# Patient Record
Sex: Female | Born: 1960 | Race: Black or African American | Hispanic: No | Marital: Single | Smoking: Never smoker
Health system: Southern US, Community
[De-identification: ages and names within clinical notes are randomized; demographics above are authoritative.]

## PROBLEM LIST (undated history)

## (undated) DIAGNOSIS — J45909 Unspecified asthma, uncomplicated: Secondary | ICD-10-CM

## (undated) DIAGNOSIS — Z973 Presence of spectacles and contact lenses: Secondary | ICD-10-CM

## (undated) DIAGNOSIS — T8859XA Other complications of anesthesia, initial encounter: Secondary | ICD-10-CM

## (undated) DIAGNOSIS — I7300 Raynaud's syndrome without gangrene: Secondary | ICD-10-CM

## (undated) DIAGNOSIS — T4145XA Adverse effect of unspecified anesthetic, initial encounter: Secondary | ICD-10-CM

## (undated) DIAGNOSIS — D649 Anemia, unspecified: Secondary | ICD-10-CM

## (undated) DIAGNOSIS — Z9289 Personal history of other medical treatment: Secondary | ICD-10-CM

## (undated) DIAGNOSIS — I341 Nonrheumatic mitral (valve) prolapse: Secondary | ICD-10-CM

## (undated) DIAGNOSIS — M5030 Other cervical disc degeneration, unspecified cervical region: Secondary | ICD-10-CM

## (undated) DIAGNOSIS — G250 Essential tremor: Secondary | ICD-10-CM

## (undated) DIAGNOSIS — R011 Cardiac murmur, unspecified: Secondary | ICD-10-CM

## (undated) DIAGNOSIS — K219 Gastro-esophageal reflux disease without esophagitis: Secondary | ICD-10-CM

## (undated) DIAGNOSIS — J302 Other seasonal allergic rhinitis: Secondary | ICD-10-CM

## (undated) DIAGNOSIS — K759 Inflammatory liver disease, unspecified: Secondary | ICD-10-CM

## (undated) DIAGNOSIS — M21961 Unspecified acquired deformity of right lower leg: Secondary | ICD-10-CM

## (undated) DIAGNOSIS — J449 Chronic obstructive pulmonary disease, unspecified: Secondary | ICD-10-CM

## (undated) DIAGNOSIS — Z8619 Personal history of other infectious and parasitic diseases: Secondary | ICD-10-CM

## (undated) DIAGNOSIS — G709 Myoneural disorder, unspecified: Secondary | ICD-10-CM

## (undated) DIAGNOSIS — R51 Headache: Secondary | ICD-10-CM

## (undated) DIAGNOSIS — Z972 Presence of dental prosthetic device (complete) (partial): Secondary | ICD-10-CM

## (undated) DIAGNOSIS — I10 Essential (primary) hypertension: Secondary | ICD-10-CM

## (undated) DIAGNOSIS — F419 Anxiety disorder, unspecified: Secondary | ICD-10-CM

## (undated) DIAGNOSIS — M1990 Unspecified osteoarthritis, unspecified site: Secondary | ICD-10-CM

## (undated) DIAGNOSIS — R29898 Other symptoms and signs involving the musculoskeletal system: Secondary | ICD-10-CM

## (undated) DIAGNOSIS — J189 Pneumonia, unspecified organism: Secondary | ICD-10-CM

## (undated) HISTORY — PX: EXPLORATORY LAPAROTOMY: SUR591

## (undated) HISTORY — PX: CORRECTION HAMMER TOE: SUR317

## (undated) HISTORY — PX: TUBAL LIGATION: SHX77

## (undated) HISTORY — PX: LUMBAR DISC SURGERY: SHX700

## (undated) HISTORY — PX: COMBINED HYSTEROSCOPY DIAGNOSTIC / D&C: SUR297

## (undated) HISTORY — PX: BACK SURGERY: SHX140

## (undated) HISTORY — DX: Chronic obstructive pulmonary disease, unspecified: J44.9

## (undated) HISTORY — DX: Unspecified asthma, uncomplicated: J45.909

## (undated) HISTORY — PX: COLONOSCOPY: SHX174

---

## 1984-10-25 HISTORY — PX: FEMUR FRACTURE SURGERY: SHX633

## 1984-10-25 HISTORY — PX: EXPLORATORY LAPAROTOMY: SUR591

## 1992-10-25 HISTORY — PX: ANTERIOR AND POSTERIOR VAGINAL REPAIR: SUR5

## 1999-02-20 ENCOUNTER — Emergency Department (HOSPITAL_COMMUNITY): Admission: EM | Admit: 1999-02-20 | Discharge: 1999-02-20 | Payer: Self-pay | Admitting: Emergency Medicine

## 1999-05-04 ENCOUNTER — Other Ambulatory Visit: Admission: RE | Admit: 1999-05-04 | Discharge: 1999-05-04 | Payer: Self-pay | Admitting: Internal Medicine

## 1999-07-31 ENCOUNTER — Encounter (INDEPENDENT_AMBULATORY_CARE_PROVIDER_SITE_OTHER): Payer: Self-pay | Admitting: Specialist

## 1999-07-31 ENCOUNTER — Other Ambulatory Visit: Admission: RE | Admit: 1999-07-31 | Discharge: 1999-07-31 | Payer: Self-pay | Admitting: Obstetrics and Gynecology

## 1999-11-09 ENCOUNTER — Encounter: Payer: Self-pay | Admitting: Obstetrics and Gynecology

## 1999-11-09 ENCOUNTER — Ambulatory Visit (HOSPITAL_COMMUNITY): Admission: RE | Admit: 1999-11-09 | Discharge: 1999-11-09 | Payer: Self-pay | Admitting: Obstetrics and Gynecology

## 1999-11-30 ENCOUNTER — Ambulatory Visit (HOSPITAL_COMMUNITY): Admission: RE | Admit: 1999-11-30 | Discharge: 1999-11-30 | Payer: Self-pay | Admitting: Obstetrics and Gynecology

## 1999-11-30 ENCOUNTER — Encounter: Payer: Self-pay | Admitting: Obstetrics and Gynecology

## 1999-12-31 ENCOUNTER — Inpatient Hospital Stay (HOSPITAL_COMMUNITY): Admission: AD | Admit: 1999-12-31 | Discharge: 2000-01-02 | Payer: Self-pay | Admitting: Obstetrics and Gynecology

## 2000-01-02 ENCOUNTER — Encounter: Payer: Self-pay | Admitting: Obstetrics and Gynecology

## 2000-01-27 ENCOUNTER — Inpatient Hospital Stay (HOSPITAL_COMMUNITY): Admission: AD | Admit: 2000-01-27 | Discharge: 2000-01-27 | Payer: Self-pay | Admitting: Obstetrics and Gynecology

## 2000-03-16 ENCOUNTER — Encounter (INDEPENDENT_AMBULATORY_CARE_PROVIDER_SITE_OTHER): Payer: Self-pay | Admitting: Specialist

## 2000-03-16 ENCOUNTER — Inpatient Hospital Stay (HOSPITAL_COMMUNITY): Admission: AD | Admit: 2000-03-16 | Discharge: 2000-03-18 | Payer: Self-pay | Admitting: Obstetrics and Gynecology

## 2000-03-24 ENCOUNTER — Inpatient Hospital Stay (HOSPITAL_COMMUNITY): Admission: AD | Admit: 2000-03-24 | Discharge: 2000-03-24 | Payer: Self-pay | Admitting: *Deleted

## 2000-05-05 ENCOUNTER — Other Ambulatory Visit: Admission: RE | Admit: 2000-05-05 | Discharge: 2000-05-05 | Payer: Self-pay | Admitting: Obstetrics and Gynecology

## 2000-12-02 ENCOUNTER — Ambulatory Visit (HOSPITAL_COMMUNITY)
Admission: RE | Admit: 2000-12-02 | Discharge: 2000-12-02 | Payer: Self-pay | Admitting: Physical Medicine and Rehabilitation

## 2001-09-20 ENCOUNTER — Ambulatory Visit (HOSPITAL_COMMUNITY): Admission: RE | Admit: 2001-09-20 | Discharge: 2001-09-20 | Payer: Self-pay | Admitting: Gastroenterology

## 2001-09-20 ENCOUNTER — Encounter: Payer: Self-pay | Admitting: Gastroenterology

## 2001-10-31 ENCOUNTER — Ambulatory Visit (HOSPITAL_COMMUNITY): Admission: RE | Admit: 2001-10-31 | Discharge: 2001-10-31 | Payer: Self-pay | Admitting: Gastroenterology

## 2001-10-31 ENCOUNTER — Encounter (INDEPENDENT_AMBULATORY_CARE_PROVIDER_SITE_OTHER): Payer: Self-pay | Admitting: Specialist

## 2002-05-30 ENCOUNTER — Other Ambulatory Visit: Admission: RE | Admit: 2002-05-30 | Discharge: 2002-05-30 | Payer: Self-pay | Admitting: Obstetrics and Gynecology

## 2002-06-06 ENCOUNTER — Encounter: Admission: RE | Admit: 2002-06-06 | Discharge: 2002-06-06 | Payer: Self-pay | Admitting: Obstetrics and Gynecology

## 2002-06-06 ENCOUNTER — Encounter: Payer: Self-pay | Admitting: Obstetrics and Gynecology

## 2002-10-25 HISTORY — PX: LUMBAR LAMINECTOMY: SHX95

## 2003-02-02 ENCOUNTER — Emergency Department (HOSPITAL_COMMUNITY): Admission: EM | Admit: 2003-02-02 | Discharge: 2003-02-02 | Payer: Self-pay | Admitting: Emergency Medicine

## 2003-02-12 ENCOUNTER — Ambulatory Visit (HOSPITAL_COMMUNITY): Admission: RE | Admit: 2003-02-12 | Discharge: 2003-02-12 | Payer: Self-pay | Admitting: Family Medicine

## 2003-02-20 ENCOUNTER — Ambulatory Visit (HOSPITAL_COMMUNITY): Admission: RE | Admit: 2003-02-20 | Discharge: 2003-02-22 | Payer: Self-pay | Admitting: Neurosurgery

## 2003-06-03 ENCOUNTER — Ambulatory Visit (HOSPITAL_COMMUNITY): Admission: RE | Admit: 2003-06-03 | Discharge: 2003-06-03 | Payer: Self-pay | Admitting: Neurosurgery

## 2003-06-24 ENCOUNTER — Encounter: Admission: RE | Admit: 2003-06-24 | Discharge: 2003-08-23 | Payer: Self-pay | Admitting: Neurosurgery

## 2003-07-26 ENCOUNTER — Other Ambulatory Visit: Admission: RE | Admit: 2003-07-26 | Discharge: 2003-07-26 | Payer: Self-pay | Admitting: Obstetrics and Gynecology

## 2003-08-26 ENCOUNTER — Ambulatory Visit (HOSPITAL_COMMUNITY): Admission: RE | Admit: 2003-08-26 | Discharge: 2003-08-26 | Payer: Self-pay | Admitting: Pediatrics

## 2003-08-26 ENCOUNTER — Encounter (INDEPENDENT_AMBULATORY_CARE_PROVIDER_SITE_OTHER): Payer: Self-pay | Admitting: Specialist

## 2003-08-30 ENCOUNTER — Ambulatory Visit (HOSPITAL_COMMUNITY): Admission: RE | Admit: 2003-08-30 | Discharge: 2003-08-30 | Payer: Self-pay | Admitting: Neurosurgery

## 2003-12-20 ENCOUNTER — Emergency Department (HOSPITAL_COMMUNITY): Admission: EM | Admit: 2003-12-20 | Discharge: 2003-12-20 | Payer: Self-pay | Admitting: Emergency Medicine

## 2004-01-12 ENCOUNTER — Inpatient Hospital Stay (HOSPITAL_COMMUNITY): Admission: EM | Admit: 2004-01-12 | Discharge: 2004-01-14 | Payer: Self-pay | Admitting: Emergency Medicine

## 2004-08-25 ENCOUNTER — Other Ambulatory Visit: Admission: RE | Admit: 2004-08-25 | Discharge: 2004-08-25 | Payer: Self-pay | Admitting: Obstetrics and Gynecology

## 2004-08-31 ENCOUNTER — Ambulatory Visit: Payer: Self-pay | Admitting: Gastroenterology

## 2004-11-09 ENCOUNTER — Ambulatory Visit: Payer: Self-pay | Admitting: Gastroenterology

## 2005-03-18 ENCOUNTER — Ambulatory Visit: Payer: Self-pay | Admitting: Gastroenterology

## 2005-07-07 ENCOUNTER — Emergency Department (HOSPITAL_COMMUNITY): Admission: EM | Admit: 2005-07-07 | Discharge: 2005-07-07 | Payer: Self-pay | Admitting: Emergency Medicine

## 2005-09-03 ENCOUNTER — Other Ambulatory Visit: Admission: RE | Admit: 2005-09-03 | Discharge: 2005-09-03 | Payer: Self-pay | Admitting: Obstetrics and Gynecology

## 2006-01-19 ENCOUNTER — Emergency Department (HOSPITAL_COMMUNITY): Admission: EM | Admit: 2006-01-19 | Discharge: 2006-01-19 | Payer: Self-pay | Admitting: Emergency Medicine

## 2006-10-12 ENCOUNTER — Encounter: Admission: RE | Admit: 2006-10-12 | Discharge: 2006-10-12 | Payer: Self-pay | Admitting: Emergency Medicine

## 2006-12-09 ENCOUNTER — Ambulatory Visit: Payer: Self-pay | Admitting: Gastroenterology

## 2008-02-27 ENCOUNTER — Ambulatory Visit: Payer: Self-pay | Admitting: Gastroenterology

## 2008-08-09 ENCOUNTER — Ambulatory Visit: Payer: Self-pay | Admitting: Internal Medicine

## 2008-08-14 ENCOUNTER — Ambulatory Visit: Payer: Self-pay | Admitting: *Deleted

## 2008-10-23 ENCOUNTER — Ambulatory Visit: Payer: Self-pay | Admitting: Family Medicine

## 2008-10-23 LAB — CONVERTED CEMR LAB
AST: 75 units/L — ABNORMAL HIGH (ref 0–37)
Albumin: 4.1 g/dL (ref 3.5–5.2)
Amphetamine Screen, Ur: NEGATIVE
Basophils Absolute: 0 10*3/uL (ref 0.0–0.1)
Basophils Relative: 1 % (ref 0–1)
Benzodiazepines.: NEGATIVE
Cocaine Metabolites: NEGATIVE
Creatinine, Ser: 0.74 mg/dL (ref 0.40–1.20)
Eosinophils Absolute: 0.2 10*3/uL (ref 0.0–0.7)
Eosinophils Relative: 2 % (ref 0–5)
HDL: 66 mg/dL (ref >39)
LDL Cholesterol: 76 mg/dL (ref 0–99)
Monocytes Absolute: 0.6 10*3/uL (ref 0.1–1.0)
Monocytes Relative: 8 % (ref 3–12)
Neutro Abs: 4.6 10*3/uL (ref 1.7–7.7)
Neutrophils Relative %: 60 % (ref 43–77)
Potassium: 3.6 meq/L (ref 3.5–5.3)
RBC: 4.12 M/uL (ref 3.87–5.11)
Sodium: 140 meq/L (ref 135–145)
TSH: 2.2 microintl units/mL (ref 0.350–4.50)
Total CHOL/HDL Ratio: 2.5
Total Protein: 7.6 g/dL (ref 6.0–8.3)
VLDL: 23 mg/dL (ref 0–40)
WBC: 7.6 10*3/uL (ref 4.0–10.5)

## 2008-11-08 ENCOUNTER — Ambulatory Visit: Payer: Self-pay | Admitting: Internal Medicine

## 2009-01-03 ENCOUNTER — Ambulatory Visit: Payer: Self-pay | Admitting: Internal Medicine

## 2009-01-15 ENCOUNTER — Ambulatory Visit: Payer: Self-pay | Admitting: Family Medicine

## 2009-02-10 ENCOUNTER — Ambulatory Visit: Payer: Self-pay | Admitting: Internal Medicine

## 2009-02-17 ENCOUNTER — Ambulatory Visit: Payer: Self-pay | Admitting: Internal Medicine

## 2009-09-01 ENCOUNTER — Ambulatory Visit: Payer: Self-pay | Admitting: Internal Medicine

## 2009-09-29 ENCOUNTER — Encounter: Payer: Self-pay | Admitting: Family Medicine

## 2009-09-29 ENCOUNTER — Ambulatory Visit: Payer: Self-pay | Admitting: Family Medicine

## 2009-09-29 LAB — CONVERTED CEMR LAB
ALT: 41 units/L — ABNORMAL HIGH (ref 0–35)
AST: 33 units/L (ref 0–37)
Alkaline Phosphatase: 63 units/L (ref 39–117)
BUN: 11 mg/dL (ref 6–23)
CO2: 26 meq/L (ref 19–32)
Calcium: 8.9 mg/dL (ref 8.4–10.5)
Chloride: 107 meq/L (ref 96–112)
Sodium: 141 meq/L (ref 135–145)
Total Bilirubin: 0.5 mg/dL (ref 0.3–1.2)
Total Protein: 7.2 g/dL (ref 6.0–8.3)

## 2009-10-21 ENCOUNTER — Encounter (INDEPENDENT_AMBULATORY_CARE_PROVIDER_SITE_OTHER): Payer: Self-pay | Admitting: Internal Medicine

## 2009-10-21 ENCOUNTER — Ambulatory Visit: Payer: Self-pay | Admitting: Family Medicine

## 2009-10-21 LAB — CONVERTED CEMR LAB
ALT: 44 units/L — ABNORMAL HIGH (ref 0–35)
AST: 48 units/L — ABNORMAL HIGH (ref 0–37)
Anti Nuclear Antibody(ANA): NEGATIVE
Basophils Absolute: 0 10*3/uL (ref 0.0–0.1)
Basophils Relative: 0 % (ref 0–1)
CO2: 26 meq/L (ref 19–32)
Calcium: 8.8 mg/dL (ref 8.4–10.5)
Creatinine, Ser: 0.78 mg/dL (ref 0.40–1.20)
Eosinophils Absolute: 0.1 10*3/uL (ref 0.0–0.7)
Eosinophils Relative: 2 % (ref 0–5)
Hemoglobin: 12.5 g/dL (ref 12.0–15.0)
Lymphocytes Relative: 23 % (ref 12–46)
Monocytes Absolute: 0.3 10*3/uL (ref 0.1–1.0)
Neutrophils Relative %: 72 % (ref 43–77)
Sodium: 140 meq/L (ref 135–145)
Total Protein: 7.3 g/dL (ref 6.0–8.3)

## 2009-10-23 ENCOUNTER — Ambulatory Visit: Payer: Self-pay | Admitting: Internal Medicine

## 2010-04-07 ENCOUNTER — Ambulatory Visit: Payer: Self-pay | Admitting: Gastroenterology

## 2010-05-01 ENCOUNTER — Ambulatory Visit: Payer: Self-pay | Admitting: Gastroenterology

## 2010-05-04 LAB — PATHOLOGY REPORT

## 2010-08-10 ENCOUNTER — Ambulatory Visit: Payer: Self-pay | Admitting: Gastroenterology
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
## 2011-03-12 NOTE — Op Note (Signed)
[facility]  Patient:    Patricia Simpson, Patricia Simpson                      MRN: [id number] Proc. Date: 03/16/00 Adm. Date:  [id number] Disc. Date: [id number] Attending:  Trevor Iha                           Operative Report  PREOPERATIVE DIAGNOSIS:       Multiparity, desires sterility.  POSTOPERATIVE DIAGNOSIS:      Multiparity, desires sterility.  OPERATION:                    Modified Pomeroy bilateral tubal ligation.  SURGEON:                      Trevor Iha, M.D.  ASSISTANT:  ANESTHESIA:                   Epidural.  ESTIMATED BLOOD LOSS:  INDICATIONS:                  [name] is a 50 year old, gravida 3, para 3,  status post vaginal delivery.  She adamantly desires sterilization.  Risks and benefits were discussed at length including failure rate of 5 out of 1000. Informed consent was obtained.  DESCRIPTION OF PROCEDURE:     After adequate analgesia, the patient was placed n the supine position.  She was sterilely prepped and draped.  A 2 cm infraumbilical skin incision was made, taken down sharply to the fascia which was incised transversely.  The peritoneum was entered sharply.  Army-Navy retractors were placed.  The right fallopian tube was identified by the fimbriated end, the midportion of the tube was grasped with Babcock clamp.  Two sutures of 0 plain ere ligated around 2 cm knuckle of fallopian tube.  The central portion was excised. The tubal ostia were noted to be hemostatic.  A suture of 2-0 silk was used to ligate the proximal section of the tube.  The tube was released back into the peritoneal cavity.  The left fallopian tube was identified in a similar fashion by the fimbriated end.  The midportion of the tube was grasped with a Babcock clamp. A knuckle of tube was doubly ligated with 0 plain suture.  Approximately 2 cm section was excised.  The tubal ostia was noted to be hemostatic.  2-0 silk was  used to  ligate the proximal portion.  The tube was released back into the peritoneal cavity.  The fascia was closed with a 0 Vicryl running suture.  The kin was then closed with a 3-0 Rapide subcuticular suture.  It was injected with 0.25% Marcaine approximately 7 cc.  The patient tolerated the procedure well and was stable on transfer to the recovery room.  Sponge, needle, and instrument counts  were correct x 3.  Estimated blood loss was less than 10 cc. DD:  03/16/00 TD:  03/19/00 Job: [id number]

## 2011-03-12 NOTE — Op Note (Signed)
NAME:  Patricia Simpson, Patricia Simpson                         ACCOUNT NO.:  [id number]   MEDICAL RECORD NO.:  [id number]                   PATIENT TYPE:  AMB   LOCATION:  SDC                                  [facility]ACILITY[facility]   PHYSICIAN:  Dineen Kid. Rana Snare, M.D.                 DATE OF BIRTH:  1961/08/07   DATE OF PROCEDURE:  08/26/2003  DATE OF DISCHARGE:                                 OPERATIVE REPORT   PREOPERATIVE DIAGNOSES:  1. Abnormal uterine bleeding.  2. Endometrial mass.  3. Fibroids.   POSTOPERATIVE DIAGNOSES:  1. Abnormal uterine bleeding.  2. Endometrial mass.  3. Fibroids.  4. Endometrial polyps.   PROCEDURE:  Hysteroscopy D&C.   SURGEON:  Dineen Kid. Rana Snare, M.D.   ANESTHESIA:  Monitored anesthetic care and paracervical block.   INDICATIONS FOR PROCEDURE:  Ms. Veith is a 50 year old with abnormal  uterine bleeding.  She is status post tubal ligation.  She underwent a  sonohistogram which shows several endometrial masses consistent with polyps  and also small fibroid.  She presents for hysteroscopy D&C for evaluation  and removal of these polyps.  Risks and benefits were discussed at length  and full consent was obtained.   FINDINGS:  Multiple polyps, approximately 3, noticed along the anterior  surface, otherwise normal-appearing ostia, cervix, normal-appearing  endometrial lining after the D&C.   DESCRIPTION OF PROCEDURE:  After adequate analgesia, the patient was placed  in the dorsal lithotomy position.  She was sterilely prepped and draped.  The bladder was sterilely drained.  Graves speculum was placed.  Tenaculum  was placed in the anterior lip of the cervix and paracervical block was  placed with 1% Xylocaine with 1:100,000 epinephrine, 20 mL used.  Uterus  sounded to 8 cm, easily dilated to a #27 Pratt dilator.  Hysteroscope was  inserted and the above findings were noted.  Curettage was performed,  retrieving medium sized polyps.  This was performed until a gritty  surface  was felt throughout the entire endometrial cavity.  After reexamination with  the hysteroscope revealed normal-appearing endometrial cavity, at this point  no residual polyps or underlying masses.  The tenaculum was removed from the  anterior lip of the cervix.  Speculum was removed.  The patient was  transferred to the recovery room in stable condition.  Sorbitol deficit was  30 mL.   ESTIMATED BLOOD LOSS:  10 mL.   The patient will be discharged home and will follow up in the office in two  to three weeks.  Sent home with a routine instruction sheet for D&C.  She  was given 30 mg of Toradol IV in the OR and Cipro 400 mg IV.  Dineen Kid Rana Snare, M.D.    DCL/MEDQ  D:  08/26/2003  T:  08/26/2003  Job:  [id number]
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
## 2011-03-12 NOTE — Op Note (Signed)
NAME:  Patricia Simpson, Patricia Simpson                         ACCOUNT NO.:  [id number]   MEDICAL RECORD NO.:  [id number]                   PATIENT TYPE:  OIB   LOCATION:  3005                                 FACILITY:  [facility]   PHYSICIAN:  Cristi Loron, M.D.            DATE OF BIRTH:  05-02-1961   DATE OF PROCEDURE:  02/20/2003  DATE OF DISCHARGE:                                 OPERATIVE REPORT   PREOPERATIVE DIAGNOSES:  Left L4-5 herniated nucleus pulposus, spinal  stenosis, lumbar radiculopathy, and lumbago.   POSTOPERATIVE DIAGNOSES:  Left L4-5 herniated nucleus pulposus, spinal  stenosis, lumbar radiculopathy, and lumbago.   PROCEDURE:  Left L4-5 microdiskectomy using microdissection.   SURGEON:  Cristi Loron, M.D.   ASSISTANT:  Clydene Fake, M.D.   ANESTHESIA:  General endotracheal.   ESTIMATED BLOOD LOSS:  Minimal.   SPECIMENS:  None.   DRAINS:  None.   COMPLICATIONS:  None.   BRIEF HISTORY:  The patient is a 50 year old black female who has suffered  from back and left leg pain.  She failed medical management and was worked  up with a lumbar MRI, which demonstrated a herniated disk at L4-5 on the  left compressing the left L4 nerve root.  The patient's signs, symptoms, and  physical exam were consistent with a left L4 radiculopathy.  I discussed the  various treatment options with her, including doing nothing, continued  medical management, steroid injections, and surgery, etc.  The patient  weighed the risks, benefits, and alternatives of surgery and decided to  proceed with a microdiskectomy.   DESCRIPTION OF PROCEDURE:  The patient was brought to the operating room by  the anesthesia team.  General endotracheal anesthesia was induced.  The  patient was then turned to the prone position on the Wilson frame.  The  lumbosacral region was then prepared with Betadine scrub and Betadine  solution and sterile drapes were applied.  I then injected the area to be  incised with Marcaine with epinephrine solution and used the scalpel to make  a linear midline incision over the L4-5 interspace.  I used electrocautery  to dissect down to the thoracolumbar fascia and divided the fascia just to  the left of midline, performing a left-sided subperiosteal dissection,  stripping the paraspinous musculature from the left spinous process and  lamina of L4 and L5.  I inserted the McCullough retractor for exposure and  then obtained the intraoperative radiograph to confirm our location.   We then brought the operating microscope into the field and under its  magnification and illumination, we completed the  microdissection/decompression.  We used the high-speed drill to perform a  left L4 laminotomy.  We completed the left L4 hemilaminectomy with the  Kerrison punch and removed the ligamentum flavum at L3-4 and L4-5.  We then  performed a foraminotomy about the left L5 and L4 nerve root.  We then used  microdissection to free up the thecal sac and the L4 and L5 nerve root from  the epidural tissue.  We encountered a large free-fragment herniated disk  which appeared to have migrated caudally from the L4-5 disk and was directly  ventral to the L4 nerve root as it exited the neural foramen.  We freed it  up with the microdissectors and then removed it in multiple fragments using  a pituitary forceps.  We then palpated along the ventral surface of the  thecal sac and along the exit route of the L4 nerve root and L5 nerve root  and noted that the neural structures were well-decompressed. We inspected  the L4-5 intervertebral disk.  We did not see any large holes in the annulus  or any impending herniations.  We therefore did not enter into the  intervertebral disk space.  We achieved stringent hemostasis using bipolar  electrocautery and then copiously irrigated the wound with bacitracin  solution and removed the solution.   I should mention that during the  diskectomy/removal of the free fragment Dr.  Phoebe Perch held the thecal sac and the L4 nerve root medially with the D'Errico  retractor.   We then removed the McCullough retractor and then reapproximated the  patient's thoracolumbar fascia with interrupted #1 Vicryl suture, the  subcutaneous tissue with interrupted 2-0 Vicryl, and the skin with Steri-  Strips and Benzoin.  The wound was then coated with bacitracin ointment and  a sterile dressing was applied, the drapes were removed, and the patient was  subsequently returned to the supine position, where she was extubated by the  anesthesia team and transported to the postanesthesia care unit in stable  condition.  All sponge, instrument, and needle counts were correct at the  end of this case.                                               Cristi Loron, M.D.    JDJ/MEDQ  D:  02/20/2003  T:  02/21/2003  Job:  [id number]
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
## 2012-06-12 ENCOUNTER — Other Ambulatory Visit: Payer: Self-pay | Admitting: Nurse Practitioner

## 2012-06-12 DIAGNOSIS — R42 Dizziness and giddiness: Secondary | ICD-10-CM

## 2012-06-12 DIAGNOSIS — M542 Cervicalgia: Secondary | ICD-10-CM

## 2012-06-13 ENCOUNTER — Ambulatory Visit
Admission: RE | Admit: 2012-06-13 | Discharge: 2012-06-13 | Disposition: A | Discharge status: Home / Self Care | Payer: BC Managed Care – PPO | Source: Ambulatory Visit | Attending: Nurse Practitioner | Admitting: Nurse Practitioner

## 2012-06-13 DIAGNOSIS — M542 Cervicalgia: Secondary | ICD-10-CM

## 2012-06-13 DIAGNOSIS — R42 Dizziness and giddiness: Secondary | ICD-10-CM

## 2012-06-13 MED ORDER — GADOBENATE DIMEGLUMINE 529 MG/ML IV SOLN
11.0000 mL | Freq: Once | INTRAVENOUS | Status: AC | PRN
  Administered 2012-06-13: 11 mL via INTRAVENOUS

## 2012-06-16 ENCOUNTER — Other Ambulatory Visit: Payer: Self-pay

## 2012-07-14 ENCOUNTER — Other Ambulatory Visit: Payer: Self-pay | Admitting: Neurology

## 2012-07-14 DIAGNOSIS — R519 Headache, unspecified: Secondary | ICD-10-CM

## 2012-07-14 DIAGNOSIS — R2681 Unsteadiness on feet: Secondary | ICD-10-CM

## 2012-07-14 DIAGNOSIS — R200 Anesthesia of skin: Secondary | ICD-10-CM

## 2012-07-14 DIAGNOSIS — R202 Paresthesia of skin: Secondary | ICD-10-CM

## 2012-07-31 ENCOUNTER — Other Ambulatory Visit: Payer: BC Managed Care – PPO

## 2012-09-15 ENCOUNTER — Other Ambulatory Visit: Payer: Self-pay | Admitting: Neurosurgery

## 2012-09-15 DIAGNOSIS — IMO0002 Reserved for concepts with insufficient information to code with codable children: Secondary | ICD-10-CM

## 2012-09-20 ENCOUNTER — Ambulatory Visit
Admission: RE | Admit: 2012-09-20 | Discharge: 2012-09-20 | Disposition: A | Discharge status: Home / Self Care | Payer: BC Managed Care – PPO | Source: Ambulatory Visit | Attending: Neurosurgery | Admitting: Neurosurgery

## 2012-09-20 DIAGNOSIS — IMO0002 Reserved for concepts with insufficient information to code with codable children: Secondary | ICD-10-CM

## 2012-09-20 MED ORDER — GADOBENATE DIMEGLUMINE 529 MG/ML IV SOLN
11.0000 mL | Freq: Once | INTRAVENOUS | Status: AC | PRN
  Administered 2012-09-20: 11 mL via INTRAVENOUS

## 2012-10-13 ENCOUNTER — Other Ambulatory Visit: Payer: BC Managed Care – PPO

## 2012-10-16 ENCOUNTER — Other Ambulatory Visit: Payer: BC Managed Care – PPO

## 2013-03-26 ENCOUNTER — Other Ambulatory Visit: Payer: Self-pay | Admitting: Neurosurgery

## 2013-04-12 NOTE — Pre-Procedure Instructions (Addendum)
Patricia Simpson  04/12/2013   Your procedure is scheduled on: Tuesday, July 1st.    Report to [facility] at 12:00 Noon.             (You will come through Entrance "A"..follow signs to [facility],  take those to 3rd floor)                Call this number if you have problems the morning of surgery: [phone]   Remember:   Do not eat food or drink liquids after midnight Monday.   Take these medicines the morning of surgery with A SIP OF WATER:    Do not wear jewelry, make-up or nail polish.  Do not wear lotions, powders, or perfumes. You may NOT wear deodorant.  Do not shave underarms & legs 48 hours prior to surgery.    Do not bring valuables to the hospital.  [facility] is not responsible  for any belongings or valuables.  Contacts, dentures or bridgework may not be worn into surgery.    Leave suitcase in the car. After surgery it may be brought to your room.  For patients admitted to the hospital, checkout time is 11:00 AM the day of discharge.   Name and phone number of your driver:    Special Instructions: Shower using CHG 2 nights before surgery and the night before surgery.  If you shower the day of surgery use CHG.  Use special wash - you have one bottle of CHG for all showers.  You should use approximately 1/3 of the bottle for each shower.   Please read over the following fact sheets that you were given: Pain Booklet, Coughing and Deep Breathing, MRSA Information and Surgical Site Infection Prevention

## 2013-04-13 ENCOUNTER — Encounter (HOSPITAL_COMMUNITY)
Admission: RE | Admit: 2013-04-13 | Discharge: 2013-04-13 | Disposition: A | Discharge status: Home / Self Care | Payer: BC Managed Care – PPO | Source: Ambulatory Visit | Attending: Neurosurgery | Admitting: Neurosurgery

## 2013-04-13 ENCOUNTER — Encounter (HOSPITAL_COMMUNITY): Payer: Self-pay

## 2013-04-13 ENCOUNTER — Ambulatory Visit (HOSPITAL_COMMUNITY)
Admission: RE | Admit: 2013-04-13 | Discharge: 2013-04-13 | Disposition: A | Discharge status: Home / Self Care | Payer: BC Managed Care – PPO | Source: Ambulatory Visit | Attending: Neurosurgery | Admitting: Neurosurgery

## 2013-04-13 DIAGNOSIS — I10 Essential (primary) hypertension: Secondary | ICD-10-CM | POA: Insufficient documentation

## 2013-04-13 DIAGNOSIS — Z01818 Encounter for other preprocedural examination: Secondary | ICD-10-CM | POA: Insufficient documentation

## 2013-04-13 HISTORY — DX: Other complications of anesthesia, initial encounter: T88.59XA

## 2013-04-13 HISTORY — DX: Gastro-esophageal reflux disease without esophagitis: K21.9

## 2013-04-13 HISTORY — DX: Adverse effect of unspecified anesthetic, initial encounter: T41.45XA

## 2013-04-13 HISTORY — DX: Essential (primary) hypertension: I10

## 2013-04-13 HISTORY — DX: Inflammatory liver disease, unspecified: K75.9

## 2013-04-13 HISTORY — DX: Headache: R51

## 2013-04-13 HISTORY — DX: Myoneural disorder, unspecified: G70.9

## 2013-04-13 HISTORY — DX: Anxiety disorder, unspecified: F41.9

## 2013-04-13 HISTORY — DX: Unspecified osteoarthritis, unspecified site: M19.90

## 2013-04-13 HISTORY — DX: Pneumonia, unspecified organism: J18.9

## 2013-04-13 HISTORY — DX: Anemia, unspecified: D64.9

## 2013-04-13 LAB — BASIC METABOLIC PANEL
BUN: 12 mg/dL (ref 6–23)
Calcium: 9.1 mg/dL (ref 8.4–10.5)
Creatinine, Ser: 0.81 mg/dL (ref 0.50–1.10)
GFR calc non Af Amer: 83 mL/min — ABNORMAL LOW (ref >90)
Glucose, Bld: 82 mg/dL (ref 70–99)

## 2013-04-13 LAB — CBC
MCH: 31 pg (ref 26.0–34.0)
MCHC: 33.2 g/dL (ref 30.0–36.0)
Platelets: 271 10*3/uL (ref 150–400)
RDW: 13.4 % (ref 11.5–15.5)

## 2013-04-13 LAB — HCG, SERUM, QUALITATIVE: Preg, Serum: NEGATIVE

## 2013-04-13 LAB — SURGICAL PCR SCREEN
MRSA, PCR: NEGATIVE
Staphylococcus aureus: NEGATIVE

## 2013-04-16 DIAGNOSIS — M47812 Spondylosis without myelopathy or radiculopathy, cervical region: Secondary | ICD-10-CM | POA: Insufficient documentation

## 2013-04-16 DIAGNOSIS — B182 Chronic viral hepatitis C: Secondary | ICD-10-CM | POA: Insufficient documentation

## 2013-04-16 DIAGNOSIS — M4802 Spinal stenosis, cervical region: Secondary | ICD-10-CM | POA: Insufficient documentation

## 2013-04-24 ENCOUNTER — Ambulatory Visit (HOSPITAL_COMMUNITY): Payer: BC Managed Care – PPO

## 2013-04-24 ENCOUNTER — Encounter (HOSPITAL_COMMUNITY)
Admission: RE | Disposition: A | Discharge status: Home / Self Care | Payer: Self-pay | Source: Ambulatory Visit | Attending: Neurosurgery

## 2013-04-24 ENCOUNTER — Ambulatory Visit (HOSPITAL_COMMUNITY)
Admission: RE | Admit: 2013-04-24 | Discharge: 2013-04-26 | Disposition: A | Discharge status: Home / Self Care | Payer: BC Managed Care – PPO | Source: Ambulatory Visit | Attending: Neurosurgery | Admitting: Neurosurgery

## 2013-04-24 ENCOUNTER — Encounter (HOSPITAL_COMMUNITY): Payer: Self-pay | Admitting: Anesthesiology

## 2013-04-24 ENCOUNTER — Ambulatory Visit (HOSPITAL_COMMUNITY): Payer: BC Managed Care – PPO | Admitting: Anesthesiology

## 2013-04-24 DIAGNOSIS — S058X9A Other injuries of unspecified eye and orbit, initial encounter: Secondary | ICD-10-CM | POA: Insufficient documentation

## 2013-04-24 DIAGNOSIS — M5000 Cervical disc disorder with myelopathy, unspecified cervical region: Secondary | ICD-10-CM | POA: Insufficient documentation

## 2013-04-24 DIAGNOSIS — Z79899 Other long term (current) drug therapy: Secondary | ICD-10-CM | POA: Insufficient documentation

## 2013-04-24 DIAGNOSIS — M4712 Other spondylosis with myelopathy, cervical region: Secondary | ICD-10-CM | POA: Insufficient documentation

## 2013-04-24 DIAGNOSIS — X58XXXA Exposure to other specified factors, initial encounter: Secondary | ICD-10-CM | POA: Insufficient documentation

## 2013-04-24 DIAGNOSIS — I10 Essential (primary) hypertension: Secondary | ICD-10-CM | POA: Insufficient documentation

## 2013-04-24 HISTORY — PX: ANTERIOR CERVICAL DECOMPRESSION/DISCECTOMY FUSION 4 LEVELS: SHX5556

## 2013-04-24 SURGERY — ANTERIOR CERVICAL DECOMPRESSION/DISCECTOMY FUSION 4 LEVELS
Anesthesia: General | Site: Neck | Wound class: Clean

## 2013-04-24 MED ORDER — DIAZEPAM 5 MG PO TABS
ORAL_TABLET | ORAL | Status: AC
  Filled 2013-04-24: qty 1

## 2013-04-24 MED ORDER — DEXTROSE 5 % IV SOLN
INTRAVENOUS | Status: DC | PRN
  Administered 2013-04-24: 11:00:00 via INTRAVENOUS

## 2013-04-24 MED ORDER — MENTHOL 3 MG MT LOZG
1.0000 | LOZENGE | OROMUCOSAL | Status: DC | PRN
  Administered 2013-04-24 (×2): 3 mg via ORAL
  Filled 2013-04-24: qty 9

## 2013-04-24 MED ORDER — ARTIFICIAL TEARS OP OINT
TOPICAL_OINTMENT | OPHTHALMIC | Status: DC | PRN
  Administered 2013-04-24: 1 via OPHTHALMIC

## 2013-04-24 MED ORDER — DOCUSATE SODIUM 100 MG PO CAPS
100.0000 mg | ORAL_CAPSULE | Freq: Two times a day (BID) | ORAL | Status: DC
  Administered 2013-04-24 – 2013-04-25 (×3): 100 mg via ORAL
  Filled 2013-04-24 (×3): qty 1

## 2013-04-24 MED ORDER — OXYBUTYNIN CHLORIDE 5 MG PO TABS
10.0000 mg | ORAL_TABLET | Freq: Every day | ORAL | Status: DC | PRN
Start: 2013-04-24 — End: 2013-04-26
  Filled 2013-04-24: qty 2

## 2013-04-24 MED ORDER — LIDOCAINE HCL (CARDIAC) 20 MG/ML IV SOLN
INTRAVENOUS | Status: DC | PRN
  Administered 2013-04-24: 80 mg via INTRAVENOUS

## 2013-04-24 MED ORDER — LABETALOL HCL 5 MG/ML IV SOLN
INTRAVENOUS | Status: DC | PRN
  Administered 2013-04-24 (×2): 2.5 mg via INTRAVENOUS
  Administered 2013-04-24: 5 mg via INTRAVENOUS
  Administered 2013-04-24 (×2): 2.5 mg via INTRAVENOUS

## 2013-04-24 MED ORDER — CEFAZOLIN SODIUM-DEXTROSE 2-3 GM-% IV SOLR: 2.0000 g | INTRAVENOUS | Status: DC

## 2013-04-24 MED ORDER — GLYCOPYRROLATE 0.2 MG/ML IJ SOLN
INTRAMUSCULAR | Status: DC | PRN
  Administered 2013-04-24: 0.6 mg via INTRAVENOUS

## 2013-04-24 MED ORDER — MIDAZOLAM HCL 2 MG/2ML IJ SOLN: 1.0000 mg | INTRAMUSCULAR | Status: DC | PRN

## 2013-04-24 MED ORDER — ROCURONIUM BROMIDE 100 MG/10ML IV SOLN
INTRAVENOUS | Status: DC | PRN
  Administered 2013-04-24 (×4): 10 mg via INTRAVENOUS
  Administered 2013-04-24: 50 mg via INTRAVENOUS

## 2013-04-24 MED ORDER — FENTANYL CITRATE 0.05 MG/ML IJ SOLN: 50.0000 ug | Freq: Once | INTRAMUSCULAR | Status: DC

## 2013-04-24 MED ORDER — ACETAMINOPHEN 650 MG RE SUPP: 650.0000 mg | RECTAL | Status: DC | PRN

## 2013-04-24 MED ORDER — DEXAMETHASONE SODIUM PHOSPHATE 4 MG/ML IJ SOLN
INTRAMUSCULAR | Status: DC | PRN
  Administered 2013-04-24: 20 mg via INTRAVENOUS

## 2013-04-24 MED ORDER — SODIUM CHLORIDE 0.9 % IR SOLN
Status: DC | PRN
  Administered 2013-04-24: 11:00:00

## 2013-04-24 MED ORDER — LACTATED RINGERS IV SOLN
INTRAVENOUS | Status: DC | PRN
  Administered 2013-04-24 (×2): via INTRAVENOUS

## 2013-04-24 MED ORDER — THROMBIN 20000 UNITS EX SOLR
CUTANEOUS | Status: DC | PRN
  Administered 2013-04-24: 11:00:00 via TOPICAL

## 2013-04-24 MED ORDER — FENTANYL CITRATE 0.05 MG/ML IJ SOLN
INTRAMUSCULAR | Status: AC
  Filled 2013-04-24: qty 2

## 2013-04-24 MED ORDER — 0.9 % SODIUM CHLORIDE (POUR BTL) OPTIME
TOPICAL | Status: DC | PRN
  Administered 2013-04-24: 1000 mL

## 2013-04-24 MED ORDER — DIAZEPAM 5 MG PO TABS
5.0000 mg | ORAL_TABLET | Freq: Four times a day (QID) | ORAL | Status: DC | PRN
  Administered 2013-04-24 – 2013-04-25 (×2): 5 mg via ORAL
  Filled 2013-04-24: qty 1

## 2013-04-24 MED ORDER — HEMOSTATIC AGENTS (NO CHARGE) OPTIME
TOPICAL | Status: DC | PRN
  Administered 2013-04-24: 1 via TOPICAL

## 2013-04-24 MED ORDER — ALUM & MAG HYDROXIDE-SIMETH 200-200-20 MG/5ML PO SUSP: 30.0000 mL | Freq: Four times a day (QID) | ORAL | Status: DC | PRN

## 2013-04-24 MED ORDER — BUPIVACAINE-EPINEPHRINE PF 0.5-1:200000 % IJ SOLN
INTRAMUSCULAR | Status: DC | PRN
  Administered 2013-04-24: 10 mL

## 2013-04-24 MED ORDER — DEXAMETHASONE 4 MG PO TABS
4.0000 mg | ORAL_TABLET | Freq: Four times a day (QID) | ORAL | Status: AC
  Filled 2013-04-24 (×3): qty 1

## 2013-04-24 MED ORDER — ONDANSETRON HCL 4 MG/2ML IJ SOLN
4.0000 mg | INTRAMUSCULAR | Status: DC | PRN
  Filled 2013-04-24: qty 2

## 2013-04-24 MED ORDER — VANCOMYCIN HCL 10 G IV SOLR
1500.0000 mg | INTRAVENOUS | Status: DC
  Administered 2013-04-24: 1500 mg via INTRAVENOUS
  Filled 2013-04-24 (×2): qty 1500

## 2013-04-24 MED ORDER — THROMBIN 5000 UNITS EX SOLR
CUTANEOUS | Status: DC | PRN
  Administered 2013-04-24 (×2): 5000 [IU] via TOPICAL

## 2013-04-24 MED ORDER — FENTANYL CITRATE 0.05 MG/ML IJ SOLN
25.0000 ug | INTRAMUSCULAR | Status: DC | PRN
  Administered 2013-04-24: 25 ug via INTRAVENOUS

## 2013-04-24 MED ORDER — DEXAMETHASONE SODIUM PHOSPHATE 4 MG/ML IJ SOLN
4.0000 mg | Freq: Four times a day (QID) | INTRAMUSCULAR | Status: AC
  Administered 2013-04-24 – 2013-04-25 (×3): 4 mg via INTRAVENOUS
  Filled 2013-04-24 (×3): qty 1

## 2013-04-24 MED ORDER — LACTATED RINGERS IV SOLN
INTRAVENOUS | Status: DC
  Administered 2013-04-24: 18:00:00 via INTRAVENOUS

## 2013-04-24 MED ORDER — IRBESARTAN 150 MG PO TABS
150.0000 mg | ORAL_TABLET | Freq: Every day | ORAL | Status: DC
  Administered 2013-04-24 – 2013-04-26 (×3): 150 mg via ORAL
  Filled 2013-04-24 (×3): qty 1

## 2013-04-24 MED ORDER — HYDROMORPHONE HCL PF 1 MG/ML IJ SOLN
0.5000 mg | INTRAMUSCULAR | Status: DC | PRN
  Administered 2013-04-24 – 2013-04-25 (×2): 0.5 mg via INTRAVENOUS
  Administered 2013-04-25: 1 mg via INTRAVENOUS
  Filled 2013-04-24 (×3): qty 1

## 2013-04-24 MED ORDER — FENTANYL CITRATE 0.05 MG/ML IJ SOLN
INTRAMUSCULAR | Status: DC | PRN
  Administered 2013-04-24 (×2): 25 ug via INTRAVENOUS
  Administered 2013-04-24: 100 ug via INTRAVENOUS
  Administered 2013-04-24: 50 ug via INTRAVENOUS
  Administered 2013-04-24: 75 ug via INTRAVENOUS
  Administered 2013-04-24 (×2): 50 ug via INTRAVENOUS
  Administered 2013-04-24: 25 ug via INTRAVENOUS

## 2013-04-24 MED ORDER — ARTIFICIAL TEARS OP OINT
TOPICAL_OINTMENT | Freq: Three times a day (TID) | OPHTHALMIC | Status: DC | PRN
  Administered 2013-04-24: 21:00:00 via OPHTHALMIC
  Filled 2013-04-24: qty 3.5

## 2013-04-24 MED ORDER — VANCOMYCIN HCL IN DEXTROSE 1-5 GM/200ML-% IV SOLN
INTRAVENOUS | Status: AC
  Administered 2013-04-24: 1000 mg via INTRAVENOUS
  Filled 2013-04-24: qty 200

## 2013-04-24 MED ORDER — ACETAMINOPHEN 325 MG PO TABS: 650.0000 mg | ORAL_TABLET | ORAL | Status: DC | PRN

## 2013-04-24 MED ORDER — BACITRACIN ZINC 500 UNIT/GM EX OINT
TOPICAL_OINTMENT | CUTANEOUS | Status: DC | PRN
  Administered 2013-04-24: 1 via TOPICAL

## 2013-04-24 MED ORDER — PHENOL 1.4 % MT LIQD: 1.0000 | OROMUCOSAL | Status: DC | PRN

## 2013-04-24 MED ORDER — BACITRACIN 50000 UNITS IM SOLR
INTRAMUSCULAR | Status: AC
  Filled 2013-04-24: qty 1

## 2013-04-24 MED ORDER — LACTATED RINGERS IV SOLN
INTRAVENOUS | Status: DC | PRN
  Administered 2013-04-24 (×2): via INTRAVENOUS

## 2013-04-24 MED ORDER — ONDANSETRON HCL 4 MG/2ML IJ SOLN
INTRAMUSCULAR | Status: DC | PRN
  Administered 2013-04-24: 4 mg via INTRAVENOUS

## 2013-04-24 MED ORDER — SODIUM CHLORIDE 0.9 % IV SOLN
INTRAVENOUS | Status: AC
  Filled 2013-04-24: qty 500

## 2013-04-24 MED ORDER — PANTOPRAZOLE SODIUM 40 MG PO TBEC
80.0000 mg | DELAYED_RELEASE_TABLET | Freq: Every day | ORAL | Status: DC
  Administered 2013-04-25 – 2013-04-26 (×2): 80 mg via ORAL
  Filled 2013-04-24: qty 1

## 2013-04-24 MED ORDER — AMLODIPINE BESYLATE 5 MG PO TABS
5.0000 mg | ORAL_TABLET | Freq: Every day | ORAL | Status: DC
  Administered 2013-04-24 – 2013-04-26 (×3): 5 mg via ORAL
  Filled 2013-04-24 (×3): qty 1

## 2013-04-24 MED ORDER — HYDROCODONE-ACETAMINOPHEN 5-325 MG PO TABS
1.0000 | ORAL_TABLET | ORAL | Status: DC | PRN
  Administered 2013-04-26: 1 via ORAL
  Filled 2013-04-24: qty 1

## 2013-04-24 MED ORDER — PROPOFOL 10 MG/ML IV BOLUS
INTRAVENOUS | Status: DC | PRN
  Administered 2013-04-24: 200 mg via INTRAVENOUS

## 2013-04-24 MED ORDER — OXYCODONE-ACETAMINOPHEN 5-325 MG PO TABS
1.0000 | ORAL_TABLET | ORAL | Status: DC | PRN
  Administered 2013-04-26: 1 via ORAL
  Filled 2013-04-24: qty 1

## 2013-04-24 MED ORDER — PROMETHAZINE HCL 25 MG/ML IJ SOLN: 6.2500 mg | INTRAMUSCULAR | Status: DC | PRN

## 2013-04-24 MED ORDER — OLMESARTAN-AMLODIPINE-HCTZ 20-5-12.5 MG PO TABS
1.0000 | ORAL_TABLET | Freq: Every day | ORAL | Status: DC | PRN
Start: 2013-04-24 — End: 2013-04-24

## 2013-04-24 MED ORDER — MIDAZOLAM HCL 5 MG/5ML IJ SOLN
INTRAMUSCULAR | Status: DC | PRN
  Administered 2013-04-24 (×3): 0.5 mg via INTRAVENOUS

## 2013-04-24 MED ORDER — HYDROCHLOROTHIAZIDE 12.5 MG PO CAPS
12.5000 mg | ORAL_CAPSULE | Freq: Every day | ORAL | Status: DC
  Administered 2013-04-24 – 2013-04-26 (×3): 12.5 mg via ORAL
  Filled 2013-04-24 (×3): qty 1

## 2013-04-24 MED ORDER — CEFAZOLIN SODIUM-DEXTROSE 2-3 GM-% IV SOLR
INTRAVENOUS | Status: AC
  Filled 2013-04-24: qty 50

## 2013-04-24 MED ORDER — NEOSTIGMINE METHYLSULFATE 1 MG/ML IJ SOLN
INTRAMUSCULAR | Status: DC | PRN
  Administered 2013-04-24: 4 mg via INTRAVENOUS

## 2013-04-24 SURGICAL SUPPLY — 69 items
APL SKNCLS STERI-STRIP NONHPOA (GAUZE/BANDAGES/DRESSINGS) ×1
BAG DECANTER FOR FLEXI CONT (MISCELLANEOUS) ×2 IMPLANT
BENZOIN TINCTURE PRP APPL 2/3 (GAUZE/BANDAGES/DRESSINGS) ×3 IMPLANT
BIT DRILL NEURO 2X3.1 SFT TUCH (MISCELLANEOUS) ×1 IMPLANT
BLADE SURG 15 STRL LF DISP TIS (BLADE) ×1 IMPLANT
BLADE SURG 15 STRL SS (BLADE) ×2
BLADE ULTRA TIP 2M (BLADE) ×2 IMPLANT
BRUSH SCRUB EZ PLAIN DRY (MISCELLANEOUS) ×2 IMPLANT
BUR BARREL STRAIGHT FLUTE 4.0 (BURR) ×2 IMPLANT
BUR MATCHSTICK NEURO 3.0 LAGG (BURR) ×2 IMPLANT
CANISTER SUCTION 2500CC (MISCELLANEOUS) ×2 IMPLANT
CLOTH BEACON ORANGE TIMEOUT ST (SAFETY) ×2 IMPLANT
CONT SPEC 4OZ CLIKSEAL STRL BL (MISCELLANEOUS) ×2 IMPLANT
COVER MAYO STAND STRL (DRAPES) ×2 IMPLANT
DEVICE FUSION VIST S 14X14X5MM (Cage) IMPLANT
DRAIN JACKSON PRATT 10MM FLAT (MISCELLANEOUS) ×2 IMPLANT
DRAPE LAPAROTOMY 100X72 PEDS (DRAPES) ×2 IMPLANT
DRAPE MICROSCOPE LEICA (MISCELLANEOUS) ×1 IMPLANT
DRAPE POUCH INSTRU U-SHP 10X18 (DRAPES) ×2 IMPLANT
DRAPE SURG 17X23 STRL (DRAPES) ×4 IMPLANT
DRILL NEURO 2X3.1 SOFT TOUCH (MISCELLANEOUS) ×2
ELECT REM PT RETURN 9FT ADLT (ELECTROSURGICAL) ×2
ELECTRODE REM PT RTRN 9FT ADLT (ELECTROSURGICAL) ×1 IMPLANT
EVACUATOR SILICONE 100CC (DRAIN) ×2 IMPLANT
GAUZE SPONGE 4X4 16PLY XRAY LF (GAUZE/BANDAGES/DRESSINGS) IMPLANT
GLOVE BIO SURGEON STRL SZ8.5 (GLOVE) ×2 IMPLANT
GLOVE BIOGEL PI IND STRL 8 (GLOVE) IMPLANT
GLOVE BIOGEL PI INDICATOR 8 (GLOVE) ×3
GLOVE ECLIPSE 8.5 STRL (GLOVE) ×1 IMPLANT
GLOVE EXAM NITRILE LRG STRL (GLOVE) IMPLANT
GLOVE EXAM NITRILE MD LF STRL (GLOVE) IMPLANT
GLOVE EXAM NITRILE XL STR (GLOVE) IMPLANT
GLOVE EXAM NITRILE XS STR PU (GLOVE) IMPLANT
GLOVE SS BIOGEL STRL SZ 8 (GLOVE) ×1 IMPLANT
GLOVE SUPERSENSE BIOGEL SZ 8 (GLOVE) ×1
GLOVE SURG SS PI 8.0 STRL IVOR (GLOVE) ×4 IMPLANT
GOWN BRE IMP SLV AUR LG STRL (GOWN DISPOSABLE) IMPLANT
GOWN BRE IMP SLV AUR XL STRL (GOWN DISPOSABLE) ×2 IMPLANT
GOWN STRL REIN 2XL LVL4 (GOWN DISPOSABLE) ×2 IMPLANT
KIT BASIN OR (CUSTOM PROCEDURE TRAY) ×2 IMPLANT
KIT ROOM TURNOVER OR (KITS) ×2 IMPLANT
MARKER SKIN DUAL TIP RULER LAB (MISCELLANEOUS) ×2 IMPLANT
NDL SPNL 18GX3.5 QUINCKE PK (NEEDLE) ×1 IMPLANT
NEEDLE HYPO 22GX1.5 SAFETY (NEEDLE) ×2 IMPLANT
NEEDLE SPNL 18GX3.5 QUINCKE PK (NEEDLE) ×2 IMPLANT
NS IRRIG 1000ML POUR BTL (IV SOLUTION) ×2 IMPLANT
PACK LAMINECTOMY NEURO (CUSTOM PROCEDURE TRAY) ×2 IMPLANT
PATTIES SURGICAL .5 X.5 (GAUZE/BANDAGES/DRESSINGS) ×6 IMPLANT
PATTIES SURGICAL 1X1 (DISPOSABLE) ×1 IMPLANT
PEEK OPTIMA VISTA 14X14X5SPINE (Peek) ×2 IMPLANT
PIN DISTRACTION 14MM (PIN) ×4 IMPLANT
PLATE ANT CERV XTEND 4 LV 60 (Plate) ×1 IMPLANT
PUTTY 10ML ACTIFUSE ABX (Putty) ×2 IMPLANT
RUBBERBAND STERILE (MISCELLANEOUS) ×2 IMPLANT
SCREW XTD VAR 4.2 SELF TAP 12 (Screw) ×10 IMPLANT
SPONGE GAUZE 4X4 12PLY (GAUZE/BANDAGES/DRESSINGS) ×2 IMPLANT
SPONGE INTESTINAL PEANUT (DISPOSABLE) ×4 IMPLANT
SPONGE SURGIFOAM ABS GEL 100 (HEMOSTASIS) ×2 IMPLANT
SPONGE SURGIFOAM ABS GEL SZ50 (HEMOSTASIS) ×1 IMPLANT
STRIP CLOSURE SKIN 1/2X4 (GAUZE/BANDAGES/DRESSINGS) ×2 IMPLANT
SUT VIC AB 0 CT1 27 (SUTURE) ×4
SUT VIC AB 0 CT1 27XBRD ANTBC (SUTURE) ×1 IMPLANT
SUT VIC AB 3-0 SH 8-18 (SUTURE) ×3 IMPLANT
SYR 20ML ECCENTRIC (SYRINGE) ×2 IMPLANT
TAPE CLOTH SURG 4X10 WHT LF (GAUZE/BANDAGES/DRESSINGS) ×2 IMPLANT
TOWEL OR 17X24 6PK STRL BLUE (TOWEL DISPOSABLE) ×2 IMPLANT
TOWEL OR 17X26 10 PK STRL BLUE (TOWEL DISPOSABLE) ×2 IMPLANT
VISTA S 14X14X5MM (Cage) ×4 IMPLANT
WATER STERILE IRR 1000ML POUR (IV SOLUTION) ×2 IMPLANT

## 2013-04-24 NOTE — Transfer of Care (Signed)
Immediate Anesthesia Transfer of Care Note  Patient: Patricia Simpson  Procedure(s) Performed: Procedure(s) with comments: ANTERIOR CERVICAL DECOMPRESSION/DISCECTOMY FUSION 4 LEVELS (N/A) - Cervical three-four, four-five, five-six, six-seven anterior cervical decompression with fusion interbody prothesis plating and bonegraft  Patient Location: PACU  Anesthesia Type:General  Level of Consciousness: alert  and patient cooperative  Airway & Oxygen Therapy: Patient Spontanous Breathing and Patient connected to nasal cannula oxygen  Post-op Assessment: Report given to PACU RN, Post -op Vital signs reviewed and stable and Patient moving all extremities  Post vital signs: Reviewed and stable  Complications: No apparent anesthesia complications

## 2013-04-24 NOTE — Preoperative (Signed)
Beta Blockers   Reason not to administer Beta Blockers:Not Applicable 
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
## 2013-04-24 NOTE — Progress Notes (Signed)
Pt admitted to 3300 from PACU. Oriented to unit and room. Safety discussed and call bell with in reach. VSS. Aspen collar in place, family at bedside.  Elijah Birk, RN

## 2013-04-24 NOTE — Anesthesia Preprocedure Evaluation (Addendum)
Anesthesia Evaluation  Patient identified by MRN, date of birth, ID band Patient awake    Reviewed: Allergy & Precautions, H&P , NPO status , Patient's Chart, lab work & pertinent test results, reviewed documented beta blocker date and time   Airway Mallampati: I TM Distance: >3 FB Neck ROM: Limited    Dental  (+) Teeth Intact and Dental Advisory Given   Pulmonary  breath sounds clear to auscultation        Cardiovascular hypertension, Pt. on medications Rhythm:Regular Rate:Normal     Neuro/Psych  Headaches, Anxiety  Neuromuscular disease    GI/Hepatic GERD-  Medicated and Controlled,(+) Hepatitis -, C  Endo/Other    Renal/GU      Musculoskeletal   Abdominal   Peds  Hematology   Anesthesia Other Findings Long upper palate with protruding upper fixed bridge  Reproductive/Obstetrics                          Anesthesia Physical Anesthesia Plan  ASA: II  Anesthesia Plan: General   Post-op Pain Management:    Induction: Intravenous  Airway Management Planned: Oral ETT and Video Laryngoscope Planned  Additional Equipment:   Intra-op Plan:   Post-operative Plan: Extubation in OR  Informed Consent: I have reviewed the patients History and Physical, chart, labs and discussed the procedure including the risks, benefits and alternatives for the proposed anesthesia with the patient or authorized representative who has indicated his/her understanding and acceptance.     Plan Discussed with: CRNA and Surgeon  Anesthesia Plan Comments:         Anesthesia Quick Evaluation

## 2013-04-24 NOTE — Op Note (Signed)
Brief history: The patient is a 52 year old black female who has complained of neck shoulder and arm pain with numbness and tingling consistent with a cervical radiculopathy/myelopathy. She has failed medical management and was worked up with a cervical MRI. This demonstrated this degeneration, herniated disc, spondylosis, etc. at C3-4, C4-5, C5-6 and C6-7. I discussed the various treatment options with the patient including surgery. She has weighed the risks, benefits, and alternatives surgery and decided proceed with a 4 level anterior cervical discectomy, fusion, and plating.  Preoperative diagnosis: C3-4, C4-5, C5-C6, and C6-7 disc degeneration, spondylosis, herniated disc, spinal stenosis, cervicalgia, cervical discopathy, cervical myelopathy  Postoperative diagnosis: Same  Procedure: C3-4, C4-5, C5-6 and C6-7 Anterior cervical discectomy/decompression; C3-4, C4-5, C5-6 and C6-7 interbody arthrodesis with local morcellized autograft bone and Actifuse bone graft extender; insertion of interbody prosthesis at C3-4, C4-5, C5-6 and C6-7 (Zimmer peek interbody prosthesis); anterior cervical plating from C3-C7 with globus titanium plate  Surgeon: Dr. Delma Officer  Asst.: Dr. Mardelle Matte pool  Anesthesia: Gen. endotracheal  Estimated blood loss: 150 cc  Drains: One 10 mm flat Jackson-Pratt drain  Complications: None  Description of procedure: The patient was brought to the operating room by the anesthesia team. General endotracheal anesthesia was induced. A roll was placed under the patient's shoulders to keep the neck in the neutral position. The patient's anterior cervical region was then prepared with Betadine scrub and Betadine solution. Sterile drapes were applied.  The area to be incised was then injected with Marcaine with epinephrine solution. I then used a scalpel to make a transverse incision in the patient's left anterior neck. I used the Metzenbaum scissors to divide the platysmal muscle and  then to dissect medial to the sternocleidomastoid muscle, jugular vein, and carotid artery. I carefully dissected down towards the anterior cervical spine identifying the esophagus and retracting it medially. Then using Kitner swabs to clear soft tissue from the anterior cervical spine. We then inserted a bent spinal needle into the upper exposed intervertebral disc space. We then obtained intraoperative radiographs confirm our location.  I then used electrocautery to detach the medial border of the longus colli muscle bilaterally from the C3-4, C4-5, C5-6 and C6-7 intervertebral disc spaces. I then inserted the Caspar self-retaining retractor underneath the longus colli muscle bilaterally to provide exposure.  We then incised the intervertebral disc at C5-6. We then performed a partial intervertebral discectomy with a pituitary forceps and the Karlin curettes. I then inserted distraction screws into the vertebral bodies at C5-C6. We then distracted the interspace. We then used the high-speed drill to decorticate the vertebral endplates at C5-6, to drill away the remainder of the intervertebral disc, to drill away some posterior spondylosis, and to thin out the posterior longitudinal ligament. I then incised ligament with the arachnoid knife. We then removed the ligament with a Kerrison punches undercutting the vertebral endplates and decompressing the thecal sac. We then performed foraminotomies about the bilateral C6 nerve roots. This completed the decompression at this level.  We then repeated this procedure in an analogous fashion at C6-7, C4-5, and C3-4 decompressing the thecal sac at these levels as well as a bilateral C4, C5, and C7 nerve roots  We now turned our to attention to the interbody fusion. We used the trial spacers to determine the appropriate size for the interbody prosthesis. We then pre-filled prosthesis with a combination of local morcellized autograft bone that we obtained during  decompression as well as Actifuse bone graft extender. We then inserted  the prosthesis into the distracted interspace at C3-4, C4-5, C5-6 and C6-7. We then removed the distraction screws. There was a good snug fit of the prosthesis in the interspace.   Having completed the fusion we now turned attention to the anterior spinal instrumentation. We used the high-speed drill to drill away some anterior spondylosis at the disc spaces so that the plate lay down flat. We selected the appropriate length titanium anterior cervical plate. We laid it along the anterior aspect of the vertebral bodies from C3-C7. We then drilled 12 mm holes at C3, C4, C5, C6 and C7. We then secured the plate to the vertebral bodies by placing two 12 mm self-tapping screws at C3, C4, C5, C6 and C7. We then obtained intraoperative radiograph. The demonstrating good position of the instrumentation. We therefore secured the screws the plate the locking each cam. This completed the instrumentation.  We then obtained hemostasis using bipolar electrocautery. We irrigated the wound out with bacitracin solution. We then removed the retractor. We inspected the esophagus for any damage. There was none apparent. We then placed a 10 mm flat Jackson-Pratt drain in the prevertebral space. We tunneled it out through a separate stab wound. We then reapproximated patient's platysmal muscle with interrupted 3-0 Vicryl suture. We then reapproximated the subcutaneous tissue with interrupted 3-0 Vicryl suture. The skin was reapproximated with Steri-Strips and benzoin. The wound was then covered with bacitracin ointment. A sterile dressing was applied. The drapes were removed. Patient was subsequently extubated by the anesthesia team and transported to the post anesthesia care unit in stable condition. All sponge instrument and needle counts were reportedly correct at the end of this case.
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
## 2013-04-24 NOTE — Progress Notes (Signed)
Patient ID: Patricia Simpson, female   DOB: 1961/03/07, 52 y.o.   MRN: [id number] Subjective:  The patient is alert and pleasant. She complains of some eye pain. She says her tingling has already improved since surgery. She is not having much neck pain or dysphagia. She looks well except for her eye.  Objective: Vital signs in last 24 hours: Temp:  [96.9 F (36.1 C)-97.6 F (36.4 C)] 97.6 F (36.4 C) (07/01 1640) Pulse Rate:  [72-98] 92 (07/01 1640) Resp:  [13-28] 13 (07/01 1640) BP: (139-154)/(72-85) 148/75 mmHg (07/01 1640) SpO2:  [100 %] 100 % (07/01 1640) Weight:  [55.594 kg (122 lb 9 oz)] 55.594 kg (122 lb 9 oz) (07/01 1640)  Intake/Output from previous day:   Intake/Output this shift: Total I/O In: 2425 [I.V.:2425] Out: 1875 [Urine:1675; Blood:200]  Physical exam the patient is alert and oriented. She looks well. I don't see any obvious abnormality in her eye. Her strength is normal in all 4 extremities including her deltoid bilaterally. Her dressing is clean and dry. There is no evidence of hematoma or shift.  Lab Results: No results found for this basename: WBC, HGB, HCT, PLT,  in the last 72 hours BMET No results found for this basename: NA, K, CL, CO2, GLUCOSE, BUN, CREATININE, CALCIUM,  in the last 72 hours  Studies/Results: Dg Cervical Spine 2-3 Views  04/24/2013   *RADIOLOGY REPORT*  Clinical Data: Cervical spine ACDF at C3-C4, C4-C5, C5-C6, and C6- C7  CERVICAL SPINE - 2-3 VIEW  Comparison: Two cross-table lateral views were obtained intraoperatively and are compared to prior radiographs of 01/11/2004.  Findings: Exam labeled #1 at 1110 hrs:  Anterior metallic probe projects over the anterior aspect of the C3-C4 disc space.  Multilevel disc space narrowing and endplate spur formation identified.  Exam labeled #2 at 1435 hrs:  Anterior plate and screws identified from C3 to C7 with disc prostheses at each disc space post C3-C7 fusion.  Anterior surgical sponges and support  tubes are noted.  No subluxation identified.  IMPRESSION: Post anterior fusion of C3-C7.   Original Report Authenticated By: Ulyses Southward, M.D.    Assessment/Plan: The patient is doing well except below. I have informed her that Dr. Dutch Quint will check in on her in my absence and discharge her.  Corneal abrasion: We will get the patient an eye patch.  LOS: 0 days     Avalee Castrellon D 04/24/2013, 6:57 PM

## 2013-04-24 NOTE — Anesthesia Postprocedure Evaluation (Signed)
  Anesthesia Post-op Note  Patient: Patricia Simpson  Procedure(s) Performed: Procedure(s) with comments: ANTERIOR CERVICAL DECOMPRESSION/DISCECTOMY FUSION 4 LEVELS (N/A) - Cervical three-four, four-five, five-six, six-seven anterior cervical decompression with fusion interbody prothesis plating and bonegraft  Patient Location: PACU  Anesthesia Type:General  Level of Consciousness: awake  Airway and Oxygen Therapy: Patient Spontanous Breathing  Post-op Pain: mild  Post-op Assessment: Post-op Vital signs reviewed, Patient's Cardiovascular Status Stable, Respiratory Function Stable, Patent Airway, No signs of Nausea or vomiting and Pain level controlled  Post-op Vital Signs: stable  Complications: No apparent anesthesia complications

## 2013-04-25 MED ORDER — IBUPROFEN 200 MG PO TABS
200.0000 mg | ORAL_TABLET | ORAL | Status: DC | PRN
  Administered 2013-04-25: 400 mg via ORAL
  Filled 2013-04-25 (×2): qty 2

## 2013-04-25 NOTE — Progress Notes (Deleted)
Per NP, ok to restart tube feedings. Also notified NP that Patient had 10 BRVT.

## 2013-04-25 NOTE — Progress Notes (Signed)
Postop day 1. Neck feels better. Arms and hands feel better. Minimal dysphagia.  Awake and alert. Oriented and appropriate. Cranial nerve function intact. Motor and sensory function of the extremities normal. Wound clean and dry. Drain removed. Chest and abdomen benign.  Doing well postop. Transfer to floor. Probable home discharge tomorrow.

## 2013-04-25 NOTE — Progress Notes (Signed)
Pt transferred to 4N12 per MD order. Report called to receiving nurse and all questions answered.

## 2013-04-25 NOTE — Progress Notes (Signed)
Vancomycin per Pharmacy Consult Note  Drain removed per MD note.  Will d/c Vancomycin per orders.  [employer], Pharm.D., BCPS Clinical Pharmacist Pager [phone] 04/25/2013 11:11 AM

## 2013-04-26 ENCOUNTER — Encounter (HOSPITAL_COMMUNITY): Payer: Self-pay | Admitting: Neurosurgery

## 2013-04-26 MED ORDER — DIAZEPAM 5 MG PO TABS: 5.0000 mg | ORAL_TABLET | Freq: Four times a day (QID) | ORAL | Status: DC | PRN

## 2013-04-26 MED ORDER — OXYCODONE-ACETAMINOPHEN 5-325 MG PO TABS: 1.0000 | ORAL_TABLET | ORAL | Status: DC | PRN

## 2013-04-26 NOTE — Discharge Summary (Signed)
Physician Discharge Summary  Patient ID: Patricia Simpson MRN: [id number] DOB/AGE: [birth year] 52 y.o.  Admit date: 04/24/2013 Discharge date: 04/26/2013  Admission Diagnoses:  Discharge Diagnoses:  Active Problems:   * No active hospital problems. *   Discharged Condition: good  Hospital Course: Patient admitted to the hospital where she underwent uncomplicated 4 level anterior cervical decompression infusion. Postoperative she is done well. Preoperative neck and upper trim his symptoms much better. Patient has been up ambulating. She is tolerating regular diet. She is ready for discharge home.  Consults:   Significant Diagnostic Studies:   Treatments:   Discharge Exam: Blood pressure 132/83, pulse 87, temperature 98.4 F (36.9 C), temperature source Oral, resp. rate 16, height 5\' 4"  (1.626 m), weight 55.594 kg (122 lb 9 oz), last menstrual period 04/20/2013, SpO2 100.00%. Awake and alert. Oriented and appropriate. Cranial nerve function intact. Motor and sensory function of the extremities normal. Wound clean and dry. Chest and abdomen benign.  Disposition:      Medication List         CALCIUM SOFT CHEWS PO  Take 1 tablet by mouth daily as needed (for calcium supplement).     cyclobenzaprine 15 MG 24 hr capsule  Commonly known as:  AMRIX  Take 15 mg by mouth daily as needed for muscle spasms.     diazepam 5 MG tablet  Commonly known as:  VALIUM  Take 1 tablet (5 mg total) by mouth every 6 (six) hours as needed.     esomeprazole 40 MG capsule  Commonly known as:  NEXIUM  Take 40 mg by mouth daily as needed (for acid reflux).     HYDROcodone-acetaminophen 10-325 MG per tablet  Commonly known as:  NORCO  Take 1 tablet by mouth every 8 (eight) hours as needed for pain.     ibuprofen 200 MG tablet  Commonly known as:  ADVIL,MOTRIN  Take 200-400 mg by mouth every 6 (six) hours as needed for pain.     oxybutynin 5 MG tablet  Commonly known as:  DITROPAN  Take 10 mg  by mouth daily as needed (for urinary urgency).     oxyCODONE-acetaminophen 5-325 MG per tablet  Commonly known as:  PERCOCET/ROXICET  Take 1-2 tablets by mouth every 4 (four) hours as needed.     TRIBENZOR 20-5-12.5 MG Tabs  Generic drug:  Olmesartan-Amlodipine-HCTZ  Take 1 tablet by mouth daily as needed (for blood bressure).           Follow-up Information   Call Cristi Loron, MD.   Contact information:   [address], [address] [place] [address] [phone]       Signed: Temple Pacini 04/26/2013, 8:28 AM

## 2013-04-26 NOTE — Progress Notes (Signed)
OT NOTE  OT stopped in hallway by RN AMY and asked to address patients questions regarding stairs. Ot entered room to speak briefly with patient. Pt had questions regarding : stairs, showering, don/doff brace, changing pads, no extra pads present so ortho tech called for additional pads, and grip weakness. Pt provided education on all topics. Pt educated to ask for outpatient OT recommendation in x1 week if grasp continues to be affected.  OT called MD office x2 requesting OT ORDER since education and hands on required to educate patient prior to d/c home. MD office returning call via RN Joni Reining on Dr Dutch Quint behalf. RN Joni Reining contacting MD Pool to request order for OT. RN Amy educated on the need for extra cervical pads for c-collar prior to d/c. Pt with no further therapy questions. No OT order placed in chart prior to patients d/c from hospital.   Lucile Shutters   OTR/L Pager: [phone] Office: [phone] .

## 2013-04-26 NOTE — Progress Notes (Signed)
Utilization Review Completed.   Letricia Krinsky, RN, BSN Nurse Case Manager  [phone]  

## 2013-04-26 NOTE — Progress Notes (Signed)
Patient received verbal and written discharge instructions from this writer and prescriptions for valium and Percocet, completed education and provided second set of pads for aspen collar, IV removed without difficulty, pt tolerated well...awaiting ride. [place], Glenard Keesling Elon Jester

## 2013-05-23 DIAGNOSIS — G8929 Other chronic pain: Secondary | ICD-10-CM | POA: Insufficient documentation

## 2013-07-04 ENCOUNTER — Ambulatory Visit: Payer: BC Managed Care – PPO | Attending: Neurology | Admitting: Physical Therapy

## 2013-07-04 DIAGNOSIS — R269 Unspecified abnormalities of gait and mobility: Secondary | ICD-10-CM | POA: Insufficient documentation

## 2013-07-04 DIAGNOSIS — R42 Dizziness and giddiness: Secondary | ICD-10-CM | POA: Insufficient documentation

## 2013-07-04 DIAGNOSIS — IMO0001 Reserved for inherently not codable concepts without codable children: Secondary | ICD-10-CM | POA: Insufficient documentation

## 2013-07-05 ENCOUNTER — Other Ambulatory Visit: Payer: Self-pay | Admitting: Neurology

## 2013-07-05 DIAGNOSIS — IMO0001 Reserved for inherently not codable concepts without codable children: Secondary | ICD-10-CM

## 2013-07-05 DIAGNOSIS — R2689 Other abnormalities of gait and mobility: Secondary | ICD-10-CM

## 2013-07-06 ENCOUNTER — Ambulatory Visit: Payer: BC Managed Care – PPO | Admitting: Physical Therapy

## 2013-07-10 ENCOUNTER — Ambulatory Visit: Payer: BC Managed Care – PPO | Admitting: Physical Therapy

## 2013-07-11 ENCOUNTER — Ambulatory Visit: Payer: BC Managed Care – PPO | Admitting: Physical Therapy

## 2013-07-12 ENCOUNTER — Ambulatory Visit: Payer: BC Managed Care – PPO | Admitting: Physical Therapy

## 2013-07-17 ENCOUNTER — Ambulatory Visit
Admission: RE | Admit: 2013-07-17 | Discharge: 2013-07-17 | Disposition: A | Discharge status: Home / Self Care | Payer: BC Managed Care – PPO | Source: Ambulatory Visit | Attending: Neurology | Admitting: Neurology

## 2013-07-17 ENCOUNTER — Ambulatory Visit: Payer: BC Managed Care – PPO | Admitting: Physical Therapy

## 2013-07-17 ENCOUNTER — Other Ambulatory Visit: Payer: Self-pay | Admitting: Neurology

## 2013-07-17 DIAGNOSIS — IMO0001 Reserved for inherently not codable concepts without codable children: Secondary | ICD-10-CM

## 2013-07-17 DIAGNOSIS — R2689 Other abnormalities of gait and mobility: Secondary | ICD-10-CM

## 2013-07-17 MED ORDER — GADOBENATE DIMEGLUMINE 529 MG/ML IV SOLN
10.0000 mL | Freq: Once | INTRAVENOUS | Status: AC | PRN
  Administered 2013-07-17: 10 mL via INTRAVENOUS

## 2013-07-19 ENCOUNTER — Ambulatory Visit: Payer: BC Managed Care – PPO | Admitting: Physical Therapy

## 2013-07-23 ENCOUNTER — Ambulatory Visit: Payer: BC Managed Care – PPO | Admitting: Physical Therapy

## 2013-07-24 ENCOUNTER — Other Ambulatory Visit: Payer: Self-pay | Admitting: Neurology

## 2013-07-24 DIAGNOSIS — R200 Anesthesia of skin: Secondary | ICD-10-CM

## 2013-07-24 DIAGNOSIS — R2689 Other abnormalities of gait and mobility: Secondary | ICD-10-CM

## 2013-07-24 DIAGNOSIS — R519 Headache, unspecified: Secondary | ICD-10-CM

## 2013-07-26 ENCOUNTER — Ambulatory Visit: Payer: BC Managed Care – PPO | Attending: Neurology | Admitting: Physical Therapy

## 2013-07-26 DIAGNOSIS — IMO0001 Reserved for inherently not codable concepts without codable children: Secondary | ICD-10-CM | POA: Insufficient documentation

## 2013-07-26 DIAGNOSIS — R42 Dizziness and giddiness: Secondary | ICD-10-CM | POA: Insufficient documentation

## 2013-07-26 DIAGNOSIS — R269 Unspecified abnormalities of gait and mobility: Secondary | ICD-10-CM | POA: Insufficient documentation

## 2013-07-31 ENCOUNTER — Encounter: Payer: BC Managed Care – PPO | Admitting: Physical Therapy

## 2013-08-01 ENCOUNTER — Ambulatory Visit: Payer: BC Managed Care – PPO | Admitting: Physical Therapy

## 2013-08-02 ENCOUNTER — Encounter: Payer: BC Managed Care – PPO | Admitting: Physical Therapy

## 2013-08-03 ENCOUNTER — Ambulatory Visit: Payer: BC Managed Care – PPO | Admitting: Physical Therapy

## 2013-08-03 ENCOUNTER — Ambulatory Visit
Admission: RE | Admit: 2013-08-03 | Discharge: 2013-08-03 | Disposition: A | Discharge status: Home / Self Care | Payer: BC Managed Care – PPO | Source: Ambulatory Visit | Attending: Neurology | Admitting: Neurology

## 2013-08-03 DIAGNOSIS — R2689 Other abnormalities of gait and mobility: Secondary | ICD-10-CM

## 2013-08-03 DIAGNOSIS — R200 Anesthesia of skin: Secondary | ICD-10-CM

## 2013-08-03 DIAGNOSIS — R519 Headache, unspecified: Secondary | ICD-10-CM

## 2013-08-03 LAB — GRAM STAIN: Gram Stain: NONE SEEN

## 2013-08-03 NOTE — Progress Notes (Signed)
Unable to draw blood X2 right and left AC. Sites unremarkable. J.Lohr RN able to get blood to go with spinal fluid. Drawn from right [facility], site unremarkable and tolerated procedure well.

## 2013-08-06 LAB — CSF PANEL 1
Glucose, CSF: 50 mg/dL (ref 43–76)
RBC Count, CSF: 0 cu mm
Total Protein, CSF: 23 mg/dL (ref 15–45)
Tube #: 3

## 2013-08-07 ENCOUNTER — Ambulatory Visit: Payer: BC Managed Care – PPO | Admitting: Physical Therapy

## 2013-08-08 LAB — MULTIPLE SCLEROSIS PANEL 2
IgG Total CSF: 2 mg/dL (ref 0.5–6.1)
IgG Total: 1423 mg/dL (ref 694–1618)
IgG: <0.01 mg/dL (ref <0.10)
IgM Total: 267 mg/dL (ref 48–271)
IgM-CSF: <0.03 mg/dL (ref <0.10)
Myelin basic protein, csf: <2 mcg/L (ref <4.1)

## 2013-08-09 ENCOUNTER — Ambulatory Visit: Payer: BC Managed Care – PPO | Admitting: Physical Therapy

## 2013-08-14 ENCOUNTER — Ambulatory Visit: Payer: BC Managed Care – PPO | Admitting: Physical Therapy

## 2013-08-16 ENCOUNTER — Ambulatory Visit: Payer: BC Managed Care – PPO | Admitting: Physical Therapy

## 2013-08-21 ENCOUNTER — Emergency Department (HOSPITAL_COMMUNITY)
Admission: EM | Admit: 2013-08-21 | Discharge: 2013-08-21 | Disposition: A | Discharge status: Home / Self Care | Payer: BC Managed Care – PPO | Attending: Emergency Medicine | Admitting: Emergency Medicine

## 2013-08-21 ENCOUNTER — Encounter (HOSPITAL_COMMUNITY): Payer: Self-pay | Admitting: Emergency Medicine

## 2013-08-21 ENCOUNTER — Emergency Department (HOSPITAL_COMMUNITY): Payer: BC Managed Care – PPO

## 2013-08-21 DIAGNOSIS — F411 Generalized anxiety disorder: Secondary | ICD-10-CM | POA: Insufficient documentation

## 2013-08-21 DIAGNOSIS — M79609 Pain in unspecified limb: Secondary | ICD-10-CM | POA: Insufficient documentation

## 2013-08-21 DIAGNOSIS — M129 Arthropathy, unspecified: Secondary | ICD-10-CM | POA: Insufficient documentation

## 2013-08-21 DIAGNOSIS — M6281 Muscle weakness (generalized): Secondary | ICD-10-CM | POA: Insufficient documentation

## 2013-08-21 DIAGNOSIS — Z8669 Personal history of other diseases of the nervous system and sense organs: Secondary | ICD-10-CM | POA: Insufficient documentation

## 2013-08-21 DIAGNOSIS — Z8619 Personal history of other infectious and parasitic diseases: Secondary | ICD-10-CM | POA: Insufficient documentation

## 2013-08-21 DIAGNOSIS — I10 Essential (primary) hypertension: Secondary | ICD-10-CM | POA: Insufficient documentation

## 2013-08-21 DIAGNOSIS — Z9889 Other specified postprocedural states: Secondary | ICD-10-CM | POA: Insufficient documentation

## 2013-08-21 DIAGNOSIS — R202 Paresthesia of skin: Secondary | ICD-10-CM

## 2013-08-21 DIAGNOSIS — Z862 Personal history of diseases of the blood and blood-forming organs and certain disorders involving the immune mechanism: Secondary | ICD-10-CM | POA: Insufficient documentation

## 2013-08-21 DIAGNOSIS — Z9089 Acquired absence of other organs: Secondary | ICD-10-CM | POA: Insufficient documentation

## 2013-08-21 DIAGNOSIS — Z880 Allergy status to penicillin: Secondary | ICD-10-CM | POA: Insufficient documentation

## 2013-08-21 DIAGNOSIS — R209 Unspecified disturbances of skin sensation: Secondary | ICD-10-CM | POA: Insufficient documentation

## 2013-08-21 DIAGNOSIS — K219 Gastro-esophageal reflux disease without esophagitis: Secondary | ICD-10-CM | POA: Insufficient documentation

## 2013-08-21 DIAGNOSIS — Z8701 Personal history of pneumonia (recurrent): Secondary | ICD-10-CM | POA: Insufficient documentation

## 2013-08-21 DIAGNOSIS — Z79899 Other long term (current) drug therapy: Secondary | ICD-10-CM | POA: Insufficient documentation

## 2013-08-21 LAB — CBC WITH DIFFERENTIAL/PLATELET
Basophils Absolute: 0 10*3/uL (ref 0.0–0.1)
HCT: 33.8 % — ABNORMAL LOW (ref 36.0–46.0)
Hemoglobin: 11.4 g/dL — ABNORMAL LOW (ref 12.0–15.0)
Lymphocytes Relative: 37 % (ref 12–46)
Monocytes Absolute: 0.5 10*3/uL (ref 0.1–1.0)
Monocytes Relative: 7 % (ref 3–12)
Neutro Abs: 3.9 10*3/uL (ref 1.7–7.7)
Neutrophils Relative %: 54 % (ref 43–77)
WBC: 7.2 10*3/uL (ref 4.0–10.5)

## 2013-08-21 LAB — POCT I-STAT, CHEM 8
BUN: 13 mg/dL (ref 6–23)
Calcium, Ion: 1.24 mmol/L — ABNORMAL HIGH (ref 1.12–1.23)
Chloride: 105 mEq/L (ref 96–112)
Glucose, Bld: 84 mg/dL (ref 70–99)
HCT: 36 % (ref 36.0–46.0)
Potassium: 3.4 mEq/L — ABNORMAL LOW (ref 3.5–5.1)

## 2013-08-21 MED ORDER — ONDANSETRON HCL 4 MG/2ML IJ SOLN
4.0000 mg | Freq: Once | INTRAMUSCULAR | Status: AC
  Administered 2013-08-21: 4 mg via INTRAVENOUS
  Filled 2013-08-21: qty 2

## 2013-08-21 MED ORDER — FENTANYL CITRATE 0.05 MG/ML IJ SOLN
50.0000 ug | Freq: Once | INTRAMUSCULAR | Status: AC
  Administered 2013-08-21: 50 ug via INTRAVENOUS
  Filled 2013-08-21: qty 2

## 2013-08-21 MED ORDER — NAPROXEN 500 MG PO TABS: 500.0000 mg | ORAL_TABLET | Freq: Two times a day (BID) | ORAL | Status: DC

## 2013-08-21 MED ORDER — DEXAMETHASONE SODIUM PHOSPHATE 10 MG/ML IJ SOLN
10.0000 mg | Freq: Once | INTRAMUSCULAR | Status: AC
  Administered 2013-08-21: 10 mg via INTRAVENOUS
  Filled 2013-08-21: qty 1

## 2013-08-21 MED ORDER — GADOBENATE DIMEGLUMINE 529 MG/ML IV SOLN
10.0000 mL | Freq: Once | INTRAVENOUS | Status: AC
  Administered 2013-08-21: 10 mL via INTRAVENOUS

## 2013-08-21 MED ORDER — CYCLOBENZAPRINE HCL 10 MG PO TABS: 10.0000 mg | ORAL_TABLET | Freq: Two times a day (BID) | ORAL | Status: DC | PRN

## 2013-08-21 NOTE — ED Notes (Addendum)
MD at bedside placing ultrasound guided IV.

## 2013-08-21 NOTE — ED Notes (Signed)
Pt was at work today, left to pick up her daughter from school and her arm suddenly started feeling heavy, numb and tingling. Pt reports it feels similar to what she has experienced before. Reports recent surgery on her back.

## 2013-08-21 NOTE — ED Notes (Signed)
2 IV attempts made by 2 different RNs. Both unsuccessful. MD notified. MRI called about delay.

## 2013-08-21 NOTE — ED Notes (Signed)
PA at bedside.
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
## 2013-08-21 NOTE — ED Notes (Signed)
Patient states she has history of back surgeries and most recently a discectomy x 4 in 07/14.   Patient states that she started having numbness and heaviness in L arm/hand.  She went to her surgeons office and Dr. Lovell Sheehan advised to come here.  She states "it feels just like it did before I had my discectomy.   Patient states it "tingles and trembles".   Patient states it is better than it was, but still feels heavy.  Patient can move her L arm/hand, albeit slow.

## 2013-08-21 NOTE — ED Notes (Signed)
Patient transported to MRI 

## 2013-08-22 NOTE — ED Provider Notes (Signed)
Medical screening examination/treatment/procedure(s) were conducted as a shared visit with non-physician practitioner(s) or resident and myself. I personally evaluated the patient during the encounter and agree with the findings and plan unless otherwise indicated.  Emergency Ultrasound Study:  Angiocath insertion Performed by: Enid Skeens  Consent: Verbal consent obtained. Risks and benefits: risks, benefits and alternatives were discussed Immediately prior to procedure the correct patient, procedure, equipment, support staff and site/side marked as needed.  Indication: difficult IV access  Preparation: Patient was prepped and draped in the usual sterile fashion.  Vein Location: right AC vein was visualized during assessment for potential access sites and was found to be patent/ easily compressed with linear ultrasound. The needle was visualized with real-time ultrasound and guided into the vein.  Gauge: 20 g  Image saved and stored.  Normal blood return. Patient tolerance: Patient tolerated the procedure well with no immediate complications. I have personally reviewed any xrays and/ or EKG's with the provider and I agree with interpretation.  Neck surgery hx. Pt presents with worsening left arm/ shoulder and neck pain with weakness similar to previous.  Exam 4/5 strength left shoulder abduction, flexion and wrist extension difficult due to pain, 5/5 RUE. No ha. No cp or sob. CNs intact.  Plan for MRI/ pain meds. Difficulty IV, I performed bedside US. MRI no acute findins. Neck pain   Enid Skeens, MD 08/22/13 [phone]

## 2013-08-28 ENCOUNTER — Ambulatory Visit: Payer: BC Managed Care – PPO | Admitting: Physical Therapy

## 2013-08-29 ENCOUNTER — Ambulatory Visit: Payer: BC Managed Care – PPO | Attending: Neurology | Admitting: Physical Therapy

## 2013-08-29 DIAGNOSIS — R269 Unspecified abnormalities of gait and mobility: Secondary | ICD-10-CM | POA: Insufficient documentation

## 2013-08-29 DIAGNOSIS — IMO0001 Reserved for inherently not codable concepts without codable children: Secondary | ICD-10-CM | POA: Insufficient documentation

## 2013-08-29 DIAGNOSIS — R42 Dizziness and giddiness: Secondary | ICD-10-CM | POA: Insufficient documentation

## 2013-09-05 ENCOUNTER — Ambulatory Visit: Payer: BC Managed Care – PPO | Admitting: Physical Therapy

## 2013-09-06 ENCOUNTER — Ambulatory Visit: Payer: BC Managed Care – PPO | Admitting: Physical Therapy

## 2013-09-11 ENCOUNTER — Ambulatory Visit: Payer: BC Managed Care – PPO | Admitting: Physical Therapy

## 2013-09-13 ENCOUNTER — Encounter: Payer: BC Managed Care – PPO | Admitting: Physical Therapy

## 2014-12-17 ENCOUNTER — Other Ambulatory Visit: Payer: Self-pay | Admitting: Family

## 2014-12-17 ENCOUNTER — Ambulatory Visit
Admission: RE | Admit: 2014-12-17 | Discharge: 2014-12-17 | Disposition: A | Discharge status: Home / Self Care | Payer: BC Managed Care – PPO | Source: Ambulatory Visit | Attending: Family | Admitting: Family

## 2014-12-17 DIAGNOSIS — R079 Chest pain, unspecified: Secondary | ICD-10-CM

## 2014-12-17 DIAGNOSIS — R071 Chest pain on breathing: Secondary | ICD-10-CM

## 2015-04-10 ENCOUNTER — Encounter: Payer: Self-pay | Admitting: Internal Medicine

## 2015-04-14 ENCOUNTER — Encounter (HOSPITAL_COMMUNITY): Payer: Self-pay | Admitting: *Deleted

## 2015-04-14 ENCOUNTER — Emergency Department (HOSPITAL_COMMUNITY): Payer: BC Managed Care – PPO

## 2015-04-14 ENCOUNTER — Emergency Department (HOSPITAL_COMMUNITY)
Admission: EM | Admit: 2015-04-14 | Discharge: 2015-04-14 | Disposition: A | Discharge status: Home / Self Care | Payer: BC Managed Care – PPO | Attending: Emergency Medicine | Admitting: Emergency Medicine

## 2015-04-14 DIAGNOSIS — R109 Unspecified abdominal pain: Secondary | ICD-10-CM

## 2015-04-14 DIAGNOSIS — Z79899 Other long term (current) drug therapy: Secondary | ICD-10-CM | POA: Diagnosis not present

## 2015-04-14 DIAGNOSIS — D649 Anemia, unspecified: Secondary | ICD-10-CM | POA: Diagnosis not present

## 2015-04-14 DIAGNOSIS — G43909 Migraine, unspecified, not intractable, without status migrainosus: Secondary | ICD-10-CM | POA: Diagnosis not present

## 2015-04-14 DIAGNOSIS — R062 Wheezing: Secondary | ICD-10-CM | POA: Diagnosis not present

## 2015-04-14 DIAGNOSIS — Z880 Allergy status to penicillin: Secondary | ICD-10-CM | POA: Insufficient documentation

## 2015-04-14 DIAGNOSIS — Z8619 Personal history of other infectious and parasitic diseases: Secondary | ICD-10-CM | POA: Diagnosis not present

## 2015-04-14 DIAGNOSIS — R634 Abnormal weight loss: Secondary | ICD-10-CM | POA: Diagnosis not present

## 2015-04-14 DIAGNOSIS — K219 Gastro-esophageal reflux disease without esophagitis: Secondary | ICD-10-CM | POA: Diagnosis not present

## 2015-04-14 DIAGNOSIS — Z8669 Personal history of other diseases of the nervous system and sense organs: Secondary | ICD-10-CM | POA: Diagnosis not present

## 2015-04-14 DIAGNOSIS — R1013 Epigastric pain: Secondary | ICD-10-CM | POA: Insufficient documentation

## 2015-04-14 DIAGNOSIS — Z8701 Personal history of pneumonia (recurrent): Secondary | ICD-10-CM | POA: Diagnosis not present

## 2015-04-14 DIAGNOSIS — R1010 Upper abdominal pain, unspecified: Secondary | ICD-10-CM | POA: Diagnosis present

## 2015-04-14 DIAGNOSIS — Z8739 Personal history of other diseases of the musculoskeletal system and connective tissue: Secondary | ICD-10-CM | POA: Insufficient documentation

## 2015-04-14 DIAGNOSIS — F419 Anxiety disorder, unspecified: Secondary | ICD-10-CM | POA: Diagnosis not present

## 2015-04-14 DIAGNOSIS — Z9889 Other specified postprocedural states: Secondary | ICD-10-CM | POA: Insufficient documentation

## 2015-04-14 DIAGNOSIS — I10 Essential (primary) hypertension: Secondary | ICD-10-CM | POA: Insufficient documentation

## 2015-04-14 LAB — HEPATIC FUNCTION PANEL
ALBUMIN: 4.3 g/dL (ref 3.5–5.0)
ALK PHOS: 48 U/L (ref 38–126)
ALT: 19 U/L (ref 14–54)
AST: 28 U/L (ref 15–41)
Bilirubin, Direct: 0.1 mg/dL (ref 0.1–0.5)
Indirect Bilirubin: 0.5 mg/dL (ref 0.3–0.9)
Total Bilirubin: 0.6 mg/dL (ref 0.3–1.2)
Total Protein: 8.2 g/dL — ABNORMAL HIGH (ref 6.5–8.1)

## 2015-04-14 LAB — CBC
HEMATOCRIT: 38.9 % (ref 36.0–46.0)
HEMOGLOBIN: 12.5 g/dL (ref 12.0–15.0)
MCH: 30.6 pg (ref 26.0–34.0)
MCHC: 32.1 g/dL (ref 30.0–36.0)
MCV: 95.3 fL (ref 78.0–100.0)
PLATELETS: 258 10*3/uL (ref 150–400)
RBC: 4.08 MIL/uL (ref 3.87–5.11)
RDW: 14.1 % (ref 11.5–15.5)
WBC: 9.9 10*3/uL (ref 4.0–10.5)

## 2015-04-14 LAB — BASIC METABOLIC PANEL
Anion gap: 12 (ref 5–15)
BUN: 11 mg/dL (ref 6–20)
CHLORIDE: 106 mmol/L (ref 101–111)
CO2: 23 mmol/L (ref 22–32)
CREATININE: 0.69 mg/dL (ref 0.44–1.00)
Calcium: 9.1 mg/dL (ref 8.9–10.3)
GFR calc Af Amer: >60 mL/min (ref >60)
GFR calc non Af Amer: >60 mL/min (ref >60)
Glucose, Bld: 76 mg/dL (ref 65–99)
Potassium: 3.7 mmol/L (ref 3.5–5.1)
Sodium: 141 mmol/L (ref 135–145)

## 2015-04-14 LAB — TROPONIN I: Troponin I: <0.03 ng/mL (ref <0.031)

## 2015-04-14 LAB — LIPASE, BLOOD: Lipase: 17 U/L — ABNORMAL LOW (ref 22–51)

## 2015-04-14 NOTE — Discharge Instructions (Signed)

## 2015-04-14 NOTE — ED Notes (Signed)
I attempted to collect labs on patient twice and was unsuccessful.  I made nurse aware. 

## 2015-04-14 NOTE — ED Notes (Signed)
Patient transported to X-ray 

## 2015-04-14 NOTE — ED Notes (Signed)
Pt complains of SOB, intermittent epigastric pain since this weekend. Pt states the pain is 10/10. Pt denies cardiac history, states she has a hx of GERD.
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
## 2015-04-15 ENCOUNTER — Encounter: Payer: Self-pay | Admitting: Physician Assistant

## 2015-04-22 ENCOUNTER — Encounter: Payer: Self-pay | Admitting: Diagnostic Neuroimaging

## 2015-04-22 ENCOUNTER — Ambulatory Visit (INDEPENDENT_AMBULATORY_CARE_PROVIDER_SITE_OTHER): Payer: BC Managed Care – PPO | Admitting: Diagnostic Neuroimaging

## 2015-04-22 VITALS — BP 119/77 | HR 82 | Ht 63.0 in | Wt 104.6 lb

## 2015-04-22 DIAGNOSIS — R200 Anesthesia of skin: Secondary | ICD-10-CM | POA: Diagnosis not present

## 2015-04-22 DIAGNOSIS — M62838 Other muscle spasm: Secondary | ICD-10-CM

## 2015-04-22 MED ORDER — GABAPENTIN 300 MG PO CAPS: 300.0000 mg | ORAL_CAPSULE | Freq: Two times a day (BID) | ORAL | Status: DC

## 2015-04-22 NOTE — Patient Instructions (Signed)
Try gabapentin 300mg  at bedtime.  I will check MRI brain adn cervical spine.
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
## 2015-04-29 ENCOUNTER — Encounter: Payer: Self-pay | Admitting: Physician Assistant

## 2015-04-29 ENCOUNTER — Ambulatory Visit (INDEPENDENT_AMBULATORY_CARE_PROVIDER_SITE_OTHER): Payer: BC Managed Care – PPO | Admitting: Physician Assistant

## 2015-04-29 VITALS — BP 96/62 | HR 80 | Ht 63.75 in | Wt 104.2 lb

## 2015-04-29 DIAGNOSIS — M5090 Cervical disc disorder, unspecified, unspecified cervical region: Secondary | ICD-10-CM | POA: Insufficient documentation

## 2015-04-29 DIAGNOSIS — R1314 Dysphagia, pharyngoesophageal phase: Secondary | ICD-10-CM | POA: Diagnosis not present

## 2015-04-29 DIAGNOSIS — R634 Abnormal weight loss: Secondary | ICD-10-CM | POA: Diagnosis not present

## 2015-04-29 DIAGNOSIS — R1013 Epigastric pain: Secondary | ICD-10-CM

## 2015-04-29 DIAGNOSIS — R1310 Dysphagia, unspecified: Secondary | ICD-10-CM

## 2015-04-29 DIAGNOSIS — J441 Chronic obstructive pulmonary disease with (acute) exacerbation: Secondary | ICD-10-CM | POA: Insufficient documentation

## 2015-04-29 DIAGNOSIS — B1920 Unspecified viral hepatitis C without hepatic coma: Secondary | ICD-10-CM | POA: Insufficient documentation

## 2015-04-29 MED ORDER — SUCRALFATE 1 GM/10ML PO SUSP: 1.0000 g | Freq: Three times a day (TID) | ORAL | Status: DC

## 2015-04-29 NOTE — Progress Notes (Signed)
i agree with the above note, plan 

## 2015-04-29 NOTE — Progress Notes (Signed)
Patient ID: Patricia Simpson, female   DOB: 05-24-1961, 54 y.o.   MRN: [id number]   Subjective:    Patient ID: Patricia Simpson, female    DOB: Sep 01, 1961, 54 y.o.   MRN: [id number]  HPI [name] is a 54 year old African-American female new to GI today referred by Dr. Cleon Dew PA-C for evaluation of weight loss, subxiphoid pain and dysphagia. Patient relates that she has had prior GI workup at the current normal clinic with the EGD and colonoscopy about 6 years ago. She also has history of hepatitis C and has been treated a total of 3 different times. Most recently she was treated through the hepatitis clinic in [place] with her bony this past winter and cleared the virus. She has history of hypertension, cervical disc disease, and diagnosis of COPD. She says her current symptoms started a couple of years ago with intermittent epigastric pain. She had a cervical discectomy done in July 2014, had a difficult time with that and says it took her about a year to get over that surgery. Now over the past 1 year she has developed intermittent chest pain and fatigue and frequent reflux after meals. She has had development of a cough and raspy voice since January 2016 and now over the past couple of months has been having stabbing pain in the subxiphoid area after meals. She admits to taking a lot of ibuprofen and is artery taken 1400 mg today for "body pain". She says she hurts all over. He has been having solid food dysphagia over the past couple of months. She says she will swallow food or pills and they will sit for a long time before going down she has not had any regurgitation but says this is uncomfortable she has to stop eating. She has been eating less because of this and has lost about 20 pounds since January. She has had this in addition to the burning and stabbing pain postprandially. Denies  any heartburn or indigestion but has been on Dexilant  60 mg by mouth daily without any benefit.  Review  of Systems Pertinent positive and negative review of systems were noted in the above HPI section.  All other review of systems was otherwise negative.  Outpatient Encounter Prescriptions as of 04/29/2015  Medication Sig  . albuterol (PROAIR HFA) 108 (90 BASE) MCG/ACT inhaler Inhale 1 puff into the lungs every 6 (six) hours as needed for wheezing or shortness of breath.  . ALPRAZolam (XANAX) 0.25 MG tablet Take 1 tablet by mouth every 8 (eight) hours as needed. anxiety  . amLODipine-olmesartan (AZOR) 5-20 MG per tablet Take 1 tablet by mouth every morning.  . ARNUITY ELLIPTA 100 MCG/ACT AEPB Inhale 1 puff into the lungs daily.  Marland Kitchen DEXILANT 60 MG capsule Take 1 capsule by mouth daily.  Marland Kitchen gabapentin (NEURONTIN) 300 MG capsule Take 1 capsule (300 mg total) by mouth 2 (two) times daily.  Marland Kitchen ibuprofen (ADVIL,MOTRIN) 200 MG tablet Take 800 mg by mouth every 6 (six) hours as needed for pain.   . nebivolol (BYSTOLIC) 10 MG tablet Take 5 mg by mouth at bedtime.  Marland Kitchen oxybutynin (DITROPAN-XL) 5 MG 24 hr tablet Take 1 tablet by mouth daily.  Marland Kitchen PRESCRIPTION MEDICATION Place 1 Squirt into the nose 3 (three) times a week. Lidocaine hcl isotonic 4% nasal solution.  Lidocaine powder, sodium chloride powder, purified water, benzoconium chloride 1% solution.  . cyclobenzaprine (FLEXERIL) 5 MG tablet Take 5 mg by mouth daily as needed for muscle spasms.  Marland Kitchen  sucralfate (CARAFATE) 1 GM/10ML suspension Take 10 mLs (1 g total) by mouth 4 (four) times daily -  with meals and at bedtime.   No facility-administered encounter medications on file as of 04/29/2015.   Allergies  Allergen Reactions  . Amoxicillin Hives and Itching  . Vesicare [Solifenacin] Itching and Other (See Comments)    vertigo  . Wax [Beeswax]   . Morphine And Related Hives  . Penicillins Hives  . Sulfa Antibiotics Hives   Patient Active Problem List   Diagnosis Date Noted  . Cervical disc disease 04/29/2015  . Hepatitis C 04/29/2015  . COPD  exacerbation 04/29/2015   History   Social History  . Marital Status: Divorced    Spouse Name: N/A  . Number of Children: 3  . Years of Education: 16   Occupational History  . teacher      [employer]   Social History Main Topics  . Smoking status: Never Smoker   . Smokeless tobacco: Not on file  . Alcohol Use: Yes     Comment: social - holidays only  . Drug Use: No  . Sexual Activity: Not on file   Other Topics Concern  . Not on file   Social History Narrative   Lives at home with 2 children   Caffeine use_ 1 cup daily    Ms. Gaydos's family history includes Breast cancer in her sister; Cancer in her brother; Heart attack in her mother; Ovarian cysts in her sister; Stroke in her paternal grandfather and paternal grandmother.      Objective:    Filed Vitals:   04/29/15 1038  BP: 96/62  Pulse: 80    Physical Exam  well-developed very thin African-American female in no acute distress, blood pressure 96/62 pulse 80 height 5 foot 3 weight 104, BMI 18. HEENT; nontraumatic normocephalic EOMI PERRLA sclera anicteric, Supple ;no JVD, Cardiovascular; regular rate and rhythm with S1-S2 no murmur or gallop, Pulmonary; clear bilaterally, Abdomen; flat soft nontender nondistended bowel sounds are active there is no palpable mass or hepatosplenomegaly, Rectal; exam not done, Extremities; no clubbing cyanosis or edema skin warm and dry, Psych ;mood and affect appropriate       Assessment & Plan:   #1 54 yo female with weight loss, dysphagia,odynophagia, epigastric pain,and subxyphoid pain- r/o esophageal stricture,neoplasm vs. Motility disorder  #2 chronic NSAID use - r/o NSAID induced gastropathy/PUD #3 cervical disc disease  #4 HTN   Plan; will obtain records from [place] 60 mg po daily Add Carafate 1gm liquid between meals and HS  Stop NSAIDS  Schedule for EGD with possible dilation with Dr.Jacobs ASAP Soft diet   Amy S Esterwood  PA-C 04/29/2015   Cc: Glendale Chard, MD

## 2015-04-29 NOTE — Patient Instructions (Signed)
Continue the Dexilant 60 mg, 1 tab daily. We sent a prescription for Carafate ( Sulcrafate) liquid to [place].  Stop the Ibuprofen.   You have been scheduled for an endoscopy. Please follow written instructions given to you at your visit today. If you use inhalers (even only as needed), please bring them with you on the day of your procedure. Your physician has requested that you go to www.startemmi.com and enter the access code given to you at your visit today. This web site gives a general overview about your procedure. However, you should still follow specific instructions given to you by our office regarding your preparation for the procedure.

## 2015-05-02 ENCOUNTER — Encounter: Payer: Self-pay | Admitting: Gastroenterology

## 2015-05-02 ENCOUNTER — Ambulatory Visit (AMBULATORY_SURGERY_CENTER): Payer: BC Managed Care – PPO | Admitting: Gastroenterology

## 2015-05-02 VITALS — BP 132/79 | HR 74 | Temp 97.2°F | Resp 20 | Ht 63.0 in | Wt 104.0 lb

## 2015-05-02 DIAGNOSIS — K2990 Gastroduodenitis, unspecified, without bleeding: Secondary | ICD-10-CM

## 2015-05-02 DIAGNOSIS — R1013 Epigastric pain: Secondary | ICD-10-CM | POA: Diagnosis not present

## 2015-05-02 DIAGNOSIS — K2970 Gastritis, unspecified, without bleeding: Secondary | ICD-10-CM

## 2015-05-02 DIAGNOSIS — K319 Disease of stomach and duodenum, unspecified: Secondary | ICD-10-CM | POA: Diagnosis not present

## 2015-05-02 MED ORDER — SODIUM CHLORIDE 0.9 % IV SOLN: 500.0000 mL | INTRAVENOUS | Status: DC

## 2015-05-02 NOTE — Progress Notes (Signed)
A/ox3 pleased with MAC, report to [facility]

## 2015-05-02 NOTE — Progress Notes (Signed)
Called to room to assist during endoscopic procedure.  Patient ID and intended procedure confirmed with present staff. Received instructions for my participation in the procedure from the performing physician.  

## 2015-05-02 NOTE — Patient Instructions (Signed)
See Procedure report for findings and recommendations  YOU HAD AN ENDOSCOPIC PROCEDURE TODAY AT THE [facility]:   Refer to the procedure report that was given to you for any specific questions about what was found during the examination.  If the procedure report does not answer your questions, please call your gastroenterologist to clarify.  If you requested that your care partner not be given the details of your procedure findings, then the procedure report has been included in a sealed envelope for you to review at your convenience later.  YOU SHOULD EXPECT: Some feelings of bloating in the abdomen. Passage of more gas than usual.  Walking can help get rid of the air that was put into your GI tract during the procedure and reduce the bloating. If you had a lower endoscopy (such as a colonoscopy or flexible sigmoidoscopy) you may notice spotting of blood in your stool or on the toilet paper. If you underwent a bowel prep for your procedure, you may not have a normal bowel movement for a few days.  Please Note:  You might notice some irritation and congestion in your nose or some drainage.  This is from the oxygen used during your procedure.  There is no need for concern and it should clear up in a day or so.  SYMPTOMS TO REPORT IMMEDIATELY:   Following lower endoscopy (colonoscopy or flexible sigmoidoscopy):  Excessive amounts of blood in the stool  Significant tenderness or worsening of abdominal pains  Swelling of the abdomen that is new, acute  Fever of 100F or higher   Following upper endoscopy (EGD)  Vomiting of blood or coffee ground material  New chest pain or pain under the shoulder blades  Painful or persistently difficult swallowing  New shortness of breath  Fever of 100F or higher  Black, tarry-looking stools  For urgent or emergent issues, a gastroenterologist can be reached at any hour by calling [phone].   DIET: Your first meal following the  procedure should be a small meal and then it is ok to progress to your normal diet. Heavy or fried foods are harder to digest and may make you feel nauseous or bloated.  Likewise, meals heavy in dairy and vegetables can increase bloating.  Drink plenty of fluids but you should avoid alcoholic beverages for 24 hours.  ACTIVITY:  You should plan to take it easy for the rest of today and you should NOT DRIVE or use heavy machinery until tomorrow (because of the sedation medicines used during the test).    FOLLOW UP: Our staff will call the number listed on your records the next business day following your procedure to check on you and address any questions or concerns that you may have regarding the information given to you following your procedure. If we do not reach you, we will leave a message.  However, if you are feeling well and you are not experiencing any problems, there is no need to return our call.  We will assume that you have returned to your regular daily activities without incident.  If any biopsies were taken you will be contacted by phone or by letter within the next 1-3 weeks.  Please call us at [phone] if you have not heard about the biopsies in 3 weeks.    SIGNATURES/CONFIDENTIALITY: You and/or your care partner have signed paperwork which will be entered into your electronic medical record.  These signatures attest to the fact that that the information above   on your After Visit Summary has been reviewed and is understood.  Full responsibility of the confidentiality of this discharge information lies with you and/or your care-partner.  Please follow all discharge instructions given to you by the recovery room nurse. If you have any questions or problems after discharge please call one of the numbers listed above. You will receive a phone call in the am to see how you are doing and answer any questions you may have. Thank you for choosing [facility] for your health  care needs. 

## 2015-05-02 NOTE — Op Note (Addendum)
[facility]  [address] [place], [address]   ENDOSCOPY PROCEDURE REPORT  PATIENT: Patricia, Simpson  MR#: [id number] BIRTHDATE: 1961-01-02 , 54  yrs. old GENDER: female ENDOSCOPIST: Milus Banister, MD REFERRED BY:  Glendale Chard, M.D. PROCEDURE DATE:  05/02/2015 PROCEDURE:  EGD w/ biopsy ASA CLASS:     Class II INDICATIONS:  Chest pain, dysphagia, weight loss. MEDICATIONS: Monitored anesthesia care and Propofol 150 mg IV TOPICAL ANESTHETIC: none  DESCRIPTION OF PROCEDURE: After the risks benefits and alternatives of the procedure were thoroughly explained, informed consent was obtained.  The LB [id number] endoscope was introduced through the mouth and advanced to the second portion of the duodenum , Without limitations.  The instrument was slowly withdrawn as the mucosa was fully examined.  The GE junction was patulous (wide open and did not close during the examination).  There was non-specific distal gastritis.  This was biopsied and sent to pathology.  The examination was otherwise normal.  Retroflexed views revealed no abnormalities.     The scope was then withdrawn from the patient and the procedure completed.  COMPLICATIONS: There were no immediate complications.  ENDOSCOPIC IMPRESSION: The GE junction was patulous (wide open and did not close during the examination).  There was non-specific distal gastritis.  This was biopsied and sent to pathology.  The examination was otherwise normal  RECOMMENDATIONS: Await final pathology results, will start approriate antibiotics if H.  pylori noted.  Continue dexilant once daily (prefer this to be 20-30 min before breakfast meal).  Please also start OTC ranitidine 150mg  pill, take one pill at bedtime every night. Continue to avoid NSAID type OTC pain medicines (such as ibuprofen, advil, alleve, motrin, aspirin, Goody's, etc).  eSigned:  Milus Banister, MD 05/02/2015 10:11 AM Revised: 05/02/2015  10:11 AM

## 2015-05-05 ENCOUNTER — Telehealth: Payer: Self-pay | Admitting: *Deleted

## 2015-05-05 NOTE — Telephone Encounter (Signed)
  Follow up Call-  Call back number 05/02/2015  Post procedure Call Back phone  # [phone]  Permission to leave phone message Yes     Patient questions:  Do you have a fever, pain , or abdominal swelling? Yes.   Pain Score  0 * patient states she has had a "migraine since procedure on Friday", she took her migraine medication over the weekend. Now she will be taking "tylenol" because she has to work today.   Have you tolerated food without any problems? Yes.    Have you been able to return to your normal activities? Yes.    Do you have any questions about your discharge instructions: Diet   No. Medications  No. Follow up visit  No.  Do you have questions or concerns about your Care? No.  Actions: * If pain score is 4 or above: No action needed, pain <4.  Patient does have hx of migraines, does take medication for this. Patient states she believes the IV sedation caused her to start having this because it hurt going in and she told John. Explained to patient to call us back if symptoms worsen or change or if not getting better. She verbalizes understanding.

## 2015-05-14 ENCOUNTER — Ambulatory Visit (INDEPENDENT_AMBULATORY_CARE_PROVIDER_SITE_OTHER): Payer: BC Managed Care – PPO

## 2015-05-14 DIAGNOSIS — M62838 Other muscle spasm: Secondary | ICD-10-CM | POA: Diagnosis not present

## 2015-05-14 DIAGNOSIS — R200 Anesthesia of skin: Secondary | ICD-10-CM

## 2015-05-21 ENCOUNTER — Telehealth: Payer: Self-pay | Admitting: Diagnostic Neuroimaging

## 2015-05-21 NOTE — Telephone Encounter (Signed)
Patient called requesting results from CT scan. Please call and advise. Patient can be reached at [phone].

## 2015-05-23 NOTE — Telephone Encounter (Signed)
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  Informed her that she may FU with neurosurgeon if she feels necessary. Reminded her of FU with Dr Leta Baptist on 06/03/15. She verbalized understanding, appreciation for this call.

## 2015-06-03 ENCOUNTER — Ambulatory Visit: Payer: BC Managed Care – PPO | Admitting: Diagnostic Neuroimaging

## 2015-06-18 ENCOUNTER — Ambulatory Visit: Payer: BC Managed Care – PPO | Admitting: Internal Medicine

## 2015-07-08 ENCOUNTER — Other Ambulatory Visit: Payer: Self-pay | Admitting: Obstetrics and Gynecology

## 2015-07-09 LAB — CYTOLOGY - PAP

## 2015-08-04 ENCOUNTER — Encounter: Payer: BC Managed Care – PPO | Admitting: Diagnostic Neuroimaging

## 2015-08-05 ENCOUNTER — Encounter: Payer: Self-pay | Admitting: Diagnostic Neuroimaging

## 2015-08-05 NOTE — Progress Notes (Signed)
  This encounter was created in error - please disregard.   Patient was a no show for this visit.

## 2015-11-14 DIAGNOSIS — M5416 Radiculopathy, lumbar region: Secondary | ICD-10-CM | POA: Insufficient documentation

## 2015-11-14 DIAGNOSIS — S129XXA Fracture of neck, unspecified, initial encounter: Secondary | ICD-10-CM | POA: Insufficient documentation

## 2015-11-14 DIAGNOSIS — M545 Low back pain, unspecified: Secondary | ICD-10-CM | POA: Insufficient documentation

## 2015-11-14 DIAGNOSIS — G8929 Other chronic pain: Secondary | ICD-10-CM | POA: Insufficient documentation

## 2015-12-25 NOTE — H&P (Addendum)
Patricia Simpson presents for hysterectomy.Had saline infusion ultrasound.  She had a recent episode several months ago where she had extensive vaginal bleeding along with pelvic pain.  Since May, she is continuing to have daily pelvic pain.  Her cycles have achieved good control with the Necon 1/35.  I gave her a prescription for Anaprox double strength and also Vicodin, and she is finding that she is having to take the Vicodin fairly regularly to help with the pain.  O:  Saline infusion ultrasound was carried out without difficulty.  It does show a slightly irregular endometrial lining but no intracavitary masses.  She has several intramural fibroids and a thickened inhomogeneous myometrium suspicious for adenomyosis, normal-appearing adnexa.   O:  Physical exam:  General:  Alert and oriented.  Normocephalic and atraumatic.  No thyromegaly or masses palpated.  Heart is regular rate and rhythm without murmur.  Lungs are clear to auscultation bilaterally.  Breasts without masses, lymphadenopathy or discharge.  Abdomen is soft, nontender, nondistended.  No rebound or guarding.  No flank pain is noted.  Extremities without clubbing, cyanosis or edema.  Normal external genitalia, Bartholin, Skene's and urethra.  Vagina without lesions.  Cervix within normal limits.  Uterus is retroverted, 8 to 10 weeks in size, mobile and nontender.  No adnexal masses are palpable.  No inguinal lymphadenopathy palpable.  A/P:  Menorrhagia, abnormal uterine bleeding not improved on birth control pills and pelvic pain.  Discussed different options with Patricia Simpson at length.  At first she was interested in NovaSure endometrial ablation due to the menorrhagia but on further discussion, she is continuing to have significant pain for the last 4 months to a point that she has had to take pain medicine every day, and this is with and without bleeding.  Because we have achieved some degree of cycle control with birth control pills but she is continuing to  have pain at this time, I do not feel that ablation will help that so we did discuss laparoscopically-assisted vaginal hysterectomy.  She desires BSO since she is perimenopausal  So we want to proceed with laparoscopically-assisted vaginal hysterectomy with bilateral salpingoopherectomy for pelvic pain, abnormal uterine bleeding, menorrhagia, fibroids and anemia.  Patricia Simpson has had significant worsening of her pain, and AUB and is requiring daily vicodan for pain relief.    This patient has been seen and examined.   All of her questions were answered.  Labs and vital signs reviewed.  Informed consent has been obtained.  The History and Physical is current. 12/30/15 0715 DL

## 2015-12-29 ENCOUNTER — Encounter (HOSPITAL_COMMUNITY)
Admission: RE | Admit: 2015-12-29 | Discharge: 2015-12-29 | Disposition: A | Discharge status: Home / Self Care | Payer: BC Managed Care – PPO | Source: Ambulatory Visit | Attending: Obstetrics and Gynecology | Admitting: Obstetrics and Gynecology

## 2015-12-29 ENCOUNTER — Encounter (HOSPITAL_COMMUNITY): Payer: Self-pay

## 2015-12-29 DIAGNOSIS — I10 Essential (primary) hypertension: Secondary | ICD-10-CM | POA: Diagnosis not present

## 2015-12-29 DIAGNOSIS — J45909 Unspecified asthma, uncomplicated: Secondary | ICD-10-CM | POA: Diagnosis not present

## 2015-12-29 DIAGNOSIS — N920 Excessive and frequent menstruation with regular cycle: Secondary | ICD-10-CM | POA: Diagnosis not present

## 2015-12-29 DIAGNOSIS — D649 Anemia, unspecified: Secondary | ICD-10-CM | POA: Diagnosis not present

## 2015-12-29 DIAGNOSIS — Z23 Encounter for immunization: Secondary | ICD-10-CM | POA: Diagnosis not present

## 2015-12-29 DIAGNOSIS — R102 Pelvic and perineal pain: Secondary | ICD-10-CM | POA: Diagnosis present

## 2015-12-29 DIAGNOSIS — D251 Intramural leiomyoma of uterus: Secondary | ICD-10-CM | POA: Diagnosis not present

## 2015-12-29 HISTORY — DX: Other seasonal allergic rhinitis: J30.2

## 2015-12-29 HISTORY — DX: Presence of dental prosthetic device (complete) (partial): Z97.2

## 2015-12-29 HISTORY — DX: Personal history of other medical treatment: Z92.89

## 2015-12-29 LAB — ABO/RH: ABO/RH(D): A POS

## 2015-12-29 LAB — TYPE AND SCREEN
ABO/RH(D): A POS
Antibody Screen: NEGATIVE

## 2015-12-29 NOTE — Anesthesia Preprocedure Evaluation (Signed)
Anesthesia Evaluation  Patient identified by MRN, date of birth, ID band Patient awake    Reviewed: Allergy & Precautions, H&P , Patient's Chart, lab work & pertinent test results, reviewed documented beta blocker date and time   Airway Mallampati: II  TM Distance: >3 FB Neck ROM: full    Dental no notable dental hx.    Pulmonary asthma ,    Pulmonary exam normal breath sounds clear to auscultation       Cardiovascular hypertension, On Medications  Rhythm:regular Rate:Normal     Neuro/Psych    GI/Hepatic   Endo/Other    Renal/GU      Musculoskeletal   Abdominal   Peds  Hematology   Anesthesia Other Findings HTN Asthma Hep C  Reproductive/Obstetrics                             Anesthesia Physical Anesthesia Plan  ASA: II  Anesthesia Plan: General   Post-op Pain Management:    Induction: Intravenous  Airway Management Planned: Oral ETT  Additional Equipment:   Intra-op Plan:   Post-operative Plan: Extubation in OR  Informed Consent: I have reviewed the patients History and Physical, chart, labs and discussed the procedure including the risks, benefits and alternatives for the proposed anesthesia with the patient or authorized representative who has indicated his/her understanding and acceptance.   Dental Advisory Given and Dental advisory given  Plan Discussed with: CRNA and Surgeon  Anesthesia Plan Comments: (  Discussed general anesthesia, including possible nausea, instrumentation of airway, sore throat,pulmonary aspiration, etc. I asked if the were any outstanding questions, or  concerns before we proceeded. )        Anesthesia Quick Evaluation

## 2015-12-29 NOTE — Progress Notes (Signed)
Per Dr. Lauretta Grill, patient is to withhold lasix on the morning of surgery.  Do Not Take Lasix.  Verified order.

## 2015-12-29 NOTE — Patient Instructions (Addendum)
Your procedure is scheduled on: Tuesday, 3/7  Enter through the Main Entrance of [facility] at: 6 am  Pick up the phone at the desk and dial [phone].  Call this number if you have problems the morning of surgery: [phone].  Remember: Do NOT eat food: after midnight tonight (Monday) Do NOT drink clear liquids after midnight tonight (Monday) Take these medicines the morning of surgery with a SIP OF WATER:  Use inhalers and nasal spray if needed.  Bring albuterol inhaler with you on day of surgery.  Take xanax if needed, dexilant, gabapentin, azor, bystolic.  Do not take lasix on the morning of surgery.  Do NOT wear jewelry (body piercing), metal hair clips/bobby pins, make-up, or nail polish. Do NOT wear lotions, powders, or perfumes.  You may wear deoderant. Do NOT shave for 48 hours prior to surgery. Do NOT bring valuables to the hospital. Contacts, dentures, or bridgework may not be worn into surgery.  Leave suitcase in car.  After surgery it may be brought to your room.  For patients admitted to the hospital, checkout time is 11:00 AM the day of discharge. Have a responsible adult drive you home and stay with you for 24 hours after your procedure.  Home with friend Hardie Shackleton cell [phone].

## 2015-12-30 ENCOUNTER — Ambulatory Visit (HOSPITAL_COMMUNITY): Payer: BC Managed Care – PPO | Admitting: Anesthesiology

## 2015-12-30 ENCOUNTER — Encounter (HOSPITAL_COMMUNITY)
Admission: RE | Disposition: A | Discharge status: Home / Self Care | Payer: Self-pay | Source: Ambulatory Visit | Attending: Obstetrics and Gynecology

## 2015-12-30 ENCOUNTER — Encounter (HOSPITAL_COMMUNITY): Payer: Self-pay | Admitting: Anesthesiology

## 2015-12-30 ENCOUNTER — Observation Stay (HOSPITAL_COMMUNITY)
Admission: RE | Admit: 2015-12-30 | Discharge: 2015-12-31 | Disposition: A | Discharge status: Home / Self Care | Payer: BC Managed Care – PPO | Source: Ambulatory Visit | Attending: Obstetrics and Gynecology | Admitting: Obstetrics and Gynecology

## 2015-12-30 DIAGNOSIS — N920 Excessive and frequent menstruation with regular cycle: Principal | ICD-10-CM | POA: Insufficient documentation

## 2015-12-30 DIAGNOSIS — D251 Intramural leiomyoma of uterus: Secondary | ICD-10-CM | POA: Insufficient documentation

## 2015-12-30 DIAGNOSIS — D649 Anemia, unspecified: Secondary | ICD-10-CM | POA: Insufficient documentation

## 2015-12-30 DIAGNOSIS — I10 Essential (primary) hypertension: Secondary | ICD-10-CM | POA: Insufficient documentation

## 2015-12-30 DIAGNOSIS — Z23 Encounter for immunization: Secondary | ICD-10-CM | POA: Insufficient documentation

## 2015-12-30 DIAGNOSIS — J45909 Unspecified asthma, uncomplicated: Secondary | ICD-10-CM | POA: Insufficient documentation

## 2015-12-30 DIAGNOSIS — Z90722 Acquired absence of ovaries, bilateral: Secondary | ICD-10-CM

## 2015-12-30 HISTORY — PX: LAPAROSCOPIC VAGINAL HYSTERECTOMY WITH SALPINGECTOMY: SHX6680

## 2015-12-30 SURGERY — HYSTERECTOMY, VAGINAL, LAPAROSCOPY-ASSISTED, WITH SALPINGECTOMY
Anesthesia: General | Site: Abdomen | Laterality: Bilateral

## 2015-12-30 MED ORDER — NALOXONE HCL 0.4 MG/ML IJ SOLN: 0.4000 mg | INTRAMUSCULAR | Status: DC | PRN

## 2015-12-30 MED ORDER — ROCURONIUM BROMIDE 100 MG/10ML IV SOLN
INTRAVENOUS | Status: AC
  Filled 2015-12-30: qty 1

## 2015-12-30 MED ORDER — FENTANYL CITRATE (PF) 100 MCG/2ML IJ SOLN
INTRAMUSCULAR | Status: AC
  Administered 2015-12-30: 50 ug via INTRAVENOUS
  Filled 2015-12-30: qty 2

## 2015-12-30 MED ORDER — HYDROMORPHONE HCL 1 MG/ML IJ SOLN
0.2000 mg | INTRAMUSCULAR | Status: DC | PRN
  Administered 2015-12-30: 0.6 mg via INTRAVENOUS
  Filled 2015-12-30: qty 1

## 2015-12-30 MED ORDER — ALBUTEROL SULFATE (2.5 MG/3ML) 0.083% IN NEBU: 3.0000 mL | INHALATION_SOLUTION | Freq: Four times a day (QID) | RESPIRATORY_TRACT | Status: DC | PRN

## 2015-12-30 MED ORDER — TOPIRAMATE 25 MG PO TABS
25.0000 mg | ORAL_TABLET | Freq: Every evening | ORAL | Status: DC | PRN
  Filled 2015-12-30: qty 1

## 2015-12-30 MED ORDER — AMLODIPINE-OLMESARTAN 5-20 MG PO TABS: 1.0000 | ORAL_TABLET | Freq: Every day | ORAL | Status: DC

## 2015-12-30 MED ORDER — DEXTROSE 5 % IV SOLN
2.0000 g | INTRAVENOUS | Status: AC
  Administered 2015-12-30: 2 g via INTRAVENOUS
  Filled 2015-12-30: qty 2

## 2015-12-30 MED ORDER — FENTANYL 40 MCG/ML IV SOLN
INTRAVENOUS | Status: DC
  Administered 2015-12-30: 30 ug via INTRAVENOUS
  Administered 2015-12-30: 13:00:00 via INTRAVENOUS
  Administered 2015-12-30: 75 ug via INTRAVENOUS
  Filled 2015-12-30 (×2): qty 25

## 2015-12-30 MED ORDER — DEXAMETHASONE SODIUM PHOSPHATE 10 MG/ML IJ SOLN
INTRAMUSCULAR | Status: DC | PRN
  Administered 2015-12-30: 4 mg via INTRAVENOUS

## 2015-12-30 MED ORDER — ONDANSETRON HCL 4 MG/2ML IJ SOLN: 4.0000 mg | Freq: Four times a day (QID) | INTRAMUSCULAR | Status: DC | PRN

## 2015-12-30 MED ORDER — IBUPROFEN 600 MG PO TABS
600.0000 mg | ORAL_TABLET | Freq: Four times a day (QID) | ORAL | Status: DC | PRN
  Administered 2015-12-30 – 2015-12-31 (×3): 600 mg via ORAL
  Filled 2015-12-30 (×3): qty 1

## 2015-12-30 MED ORDER — LACTATED RINGERS IV SOLN
INTRAVENOUS | Status: DC
  Administered 2015-12-30 (×3): via INTRAVENOUS

## 2015-12-30 MED ORDER — SCOPOLAMINE 1 MG/3DAYS TD PT72
1.0000 | MEDICATED_PATCH | Freq: Once | TRANSDERMAL | Status: DC
Start: 2015-12-30 — End: 2015-12-30
  Administered 2015-12-30: 1.5 mg via TRANSDERMAL

## 2015-12-30 MED ORDER — SCOPOLAMINE 1 MG/3DAYS TD PT72
MEDICATED_PATCH | TRANSDERMAL | Status: AC
  Administered 2015-12-30: 1.5 mg via TRANSDERMAL
  Filled 2015-12-30: qty 1

## 2015-12-30 MED ORDER — ROCURONIUM BROMIDE 100 MG/10ML IV SOLN
INTRAVENOUS | Status: DC | PRN
  Administered 2015-12-30: 40 mg via INTRAVENOUS

## 2015-12-30 MED ORDER — ACETAMINOPHEN 160 MG/5ML PO SOLN
ORAL | Status: AC
  Administered 2015-12-30: 975 mg via ORAL
  Filled 2015-12-30: qty 40.6

## 2015-12-30 MED ORDER — MIDAZOLAM HCL 2 MG/2ML IJ SOLN
INTRAMUSCULAR | Status: DC | PRN
  Administered 2015-12-30: 0.5 mg via INTRAVENOUS
  Administered 2015-12-30: 1 mg via INTRAVENOUS

## 2015-12-30 MED ORDER — GLYCOPYRROLATE 0.2 MG/ML IJ SOLN
INTRAMUSCULAR | Status: AC
  Filled 2015-12-30: qty 3

## 2015-12-30 MED ORDER — NEOSTIGMINE METHYLSULFATE 10 MG/10ML IV SOLN
INTRAVENOUS | Status: DC | PRN
  Administered 2015-12-30: 4 mg via INTRAVENOUS

## 2015-12-30 MED ORDER — GLYCOPYRROLATE 0.2 MG/ML IJ SOLN
INTRAMUSCULAR | Status: DC | PRN
  Administered 2015-12-30: 0.6 mg via INTRAVENOUS

## 2015-12-30 MED ORDER — LIDOCAINE HCL (CARDIAC) 20 MG/ML IV SOLN
INTRAVENOUS | Status: DC | PRN
  Administered 2015-12-30: 40 mg via INTRAVENOUS
  Administered 2015-12-30: 20 mg via INTRAVENOUS

## 2015-12-30 MED ORDER — BUPIVACAINE HCL (PF) 0.25 % IJ SOLN
INTRAMUSCULAR | Status: AC
  Filled 2015-12-30: qty 30

## 2015-12-30 MED ORDER — FENTANYL CITRATE (PF) 250 MCG/5ML IJ SOLN
INTRAMUSCULAR | Status: AC
  Filled 2015-12-30: qty 5

## 2015-12-30 MED ORDER — SODIUM CHLORIDE 0.9% FLUSH: 9.0000 mL | INTRAVENOUS | Status: DC | PRN

## 2015-12-30 MED ORDER — DEXTROSE-NACL 5-0.45 % IV SOLN
INTRAVENOUS | Status: DC
  Administered 2015-12-30: 18:00:00 via INTRAVENOUS

## 2015-12-30 MED ORDER — MENTHOL 3 MG MT LOZG
1.0000 | LOZENGE | OROMUCOSAL | Status: DC | PRN
  Filled 2015-12-30: qty 9

## 2015-12-30 MED ORDER — MIDAZOLAM HCL 2 MG/2ML IJ SOLN
INTRAMUSCULAR | Status: AC
  Filled 2015-12-30: qty 2

## 2015-12-30 MED ORDER — DIPHENHYDRAMINE HCL 12.5 MG/5ML PO ELIX: 12.5000 mg | ORAL_SOLUTION | Freq: Four times a day (QID) | ORAL | Status: DC | PRN

## 2015-12-30 MED ORDER — IRBESARTAN 150 MG PO TABS
150.0000 mg | ORAL_TABLET | Freq: Every day | ORAL | Status: DC
  Filled 2015-12-30 (×2): qty 1

## 2015-12-30 MED ORDER — KETOROLAC TROMETHAMINE 30 MG/ML IJ SOLN
INTRAMUSCULAR | Status: AC
  Filled 2015-12-30: qty 1

## 2015-12-30 MED ORDER — ONDANSETRON HCL 4 MG/2ML IJ SOLN
INTRAMUSCULAR | Status: AC
  Filled 2015-12-30: qty 2

## 2015-12-30 MED ORDER — NEBIVOLOL HCL 10 MG PO TABS
10.0000 mg | ORAL_TABLET | Freq: Every day | ORAL | Status: DC
  Filled 2015-12-30: qty 1

## 2015-12-30 MED ORDER — BUPIVACAINE HCL (PF) 0.25 % IJ SOLN
INTRAMUSCULAR | Status: DC | PRN
  Administered 2015-12-30: 10 mL

## 2015-12-30 MED ORDER — AMLODIPINE BESYLATE 5 MG PO TABS
5.0000 mg | ORAL_TABLET | Freq: Every day | ORAL | Status: DC
  Filled 2015-12-30: qty 1

## 2015-12-30 MED ORDER — PHENYLEPHRINE HCL 10 MG/ML IJ SOLN
INTRAMUSCULAR | Status: DC | PRN
  Administered 2015-12-30: 120 ug via INTRAVENOUS
  Administered 2015-12-30: 80 ug via INTRAVENOUS

## 2015-12-30 MED ORDER — NEOSTIGMINE METHYLSULFATE 10 MG/10ML IV SOLN
INTRAVENOUS | Status: AC
  Filled 2015-12-30: qty 1

## 2015-12-30 MED ORDER — ALBUTEROL SULFATE (2.5 MG/3ML) 0.083% IN NEBU
2.5000 mg | INHALATION_SOLUTION | Freq: Once | RESPIRATORY_TRACT | Status: AC
  Administered 2015-12-30: 2.5 mg via RESPIRATORY_TRACT

## 2015-12-30 MED ORDER — LACTATED RINGERS IR SOLN
Status: DC | PRN
  Administered 2015-12-30: 3000 mL

## 2015-12-30 MED ORDER — FENTANYL CITRATE (PF) 100 MCG/2ML IJ SOLN
INTRAMUSCULAR | Status: DC | PRN
  Administered 2015-12-30 (×4): 50 ug via INTRAVENOUS

## 2015-12-30 MED ORDER — FUROSEMIDE 20 MG PO TABS
10.0000 mg | ORAL_TABLET | Freq: Every day | ORAL | Status: DC
  Administered 2015-12-30 – 2015-12-31 (×2): 10 mg via ORAL
  Filled 2015-12-30 (×3): qty 0.5

## 2015-12-30 MED ORDER — ONDANSETRON HCL 4 MG/2ML IJ SOLN
INTRAMUSCULAR | Status: DC | PRN
  Administered 2015-12-30: 4 mg via INTRAVENOUS

## 2015-12-30 MED ORDER — DEXAMETHASONE SODIUM PHOSPHATE 4 MG/ML IJ SOLN
INTRAMUSCULAR | Status: AC
  Filled 2015-12-30: qty 1

## 2015-12-30 MED ORDER — KETOROLAC TROMETHAMINE 30 MG/ML IJ SOLN
INTRAMUSCULAR | Status: DC | PRN
  Administered 2015-12-30: 30 mg via INTRAVENOUS

## 2015-12-30 MED ORDER — OXYCODONE-ACETAMINOPHEN 5-325 MG PO TABS: 1.0000 | ORAL_TABLET | ORAL | Status: DC | PRN

## 2015-12-30 MED ORDER — ALBUTEROL SULFATE (2.5 MG/3ML) 0.083% IN NEBU
INHALATION_SOLUTION | RESPIRATORY_TRACT | Status: AC
  Filled 2015-12-30: qty 3

## 2015-12-30 MED ORDER — PHENYLEPHRINE 40 MCG/ML (10ML) SYRINGE FOR IV PUSH (FOR BLOOD PRESSURE SUPPORT)
PREFILLED_SYRINGE | INTRAVENOUS | Status: AC
  Filled 2015-12-30: qty 10

## 2015-12-30 MED ORDER — ALPRAZOLAM 0.5 MG PO TABS
0.5000 mg | ORAL_TABLET | Freq: Every day | ORAL | Status: DC | PRN
Start: 2015-12-30 — End: 2015-12-31

## 2015-12-30 MED ORDER — HYDROMORPHONE HCL 2 MG PO TABS
1.0000 mg | ORAL_TABLET | ORAL | Status: DC | PRN
  Administered 2015-12-30 – 2015-12-31 (×4): 1 mg via ORAL
  Filled 2015-12-30 (×4): qty 1

## 2015-12-30 MED ORDER — FENTANYL CITRATE (PF) 100 MCG/2ML IJ SOLN
25.0000 ug | INTRAMUSCULAR | Status: DC | PRN
  Administered 2015-12-30 (×3): 50 ug via INTRAVENOUS

## 2015-12-30 MED ORDER — PROPOFOL 10 MG/ML IV BOLUS
INTRAVENOUS | Status: DC | PRN
  Administered 2015-12-30: 150 mg via INTRAVENOUS

## 2015-12-30 MED ORDER — GABAPENTIN 300 MG PO CAPS
300.0000 mg | ORAL_CAPSULE | Freq: Every day | ORAL | Status: DC
  Administered 2015-12-30 – 2015-12-31 (×2): 300 mg via ORAL
  Filled 2015-12-30 (×3): qty 1

## 2015-12-30 MED ORDER — DIPHENHYDRAMINE HCL 50 MG/ML IJ SOLN: 12.5000 mg | Freq: Four times a day (QID) | INTRAMUSCULAR | Status: DC | PRN

## 2015-12-30 MED ORDER — PROPOFOL 10 MG/ML IV BOLUS
INTRAVENOUS | Status: AC
  Filled 2015-12-30: qty 20

## 2015-12-30 MED ORDER — LIDOCAINE HCL (CARDIAC) 20 MG/ML IV SOLN
INTRAVENOUS | Status: AC
  Filled 2015-12-30: qty 5

## 2015-12-30 MED ORDER — FLUTICASONE PROPIONATE 50 MCG/ACT NA SUSP: 1.0000 | Freq: Every day | NASAL | Status: DC | PRN

## 2015-12-30 MED ORDER — ACETAMINOPHEN 160 MG/5ML PO SOLN
975.0000 mg | Freq: Once | ORAL | Status: AC
  Administered 2015-12-30: 975 mg via ORAL

## 2015-12-30 SURGICAL SUPPLY — 47 items
BLADE SURG 15 STRL LF C SS BP (BLADE) ×1 IMPLANT
BLADE SURG 15 STRL SS (BLADE) ×2
CABLE HIGH FREQUENCY MONO STRZ (ELECTRODE) IMPLANT
CATH ROBINSON RED A/P 16FR (CATHETERS) ×2 IMPLANT
CLOTH BEACON ORANGE TIMEOUT ST (SAFETY) ×2 IMPLANT
CONT PATH 16OZ SNAP LID 3702 (MISCELLANEOUS) ×2 IMPLANT
COVER BACK TABLE 60X90IN (DRAPES) ×2 IMPLANT
DECANTER SPIKE VIAL GLASS SM (MISCELLANEOUS) ×1 IMPLANT
DRSG COVADERM PLUS 2X2 (GAUZE/BANDAGES/DRESSINGS) IMPLANT
DRSG OPSITE POSTOP 3X4 (GAUZE/BANDAGES/DRESSINGS) IMPLANT
DURAPREP 26ML APPLICATOR (WOUND CARE) ×2 IMPLANT
ELECT LIGASURE LONG (ELECTRODE) ×2 IMPLANT
ELECT REM PT RETURN 9FT ADLT (ELECTROSURGICAL) ×2
ELECTRODE REM PT RTRN 9FT ADLT (ELECTROSURGICAL) IMPLANT
FORCEPS CUTTING 45CM 5MM (CUTTING FORCEPS) ×2 IMPLANT
GLOVE BIO SURGEON STRL SZ8 (GLOVE) ×2 IMPLANT
GLOVE BIOGEL PI IND STRL 6.5 (GLOVE) ×1 IMPLANT
GLOVE BIOGEL PI IND STRL 7.0 (GLOVE) ×3 IMPLANT
GLOVE BIOGEL PI INDICATOR 6.5 (GLOVE) ×1
GLOVE BIOGEL PI INDICATOR 7.0 (GLOVE) ×3
GLOVE SURG ORTHO 8.0 STRL STRW (GLOVE) ×6 IMPLANT
LEGGING LITHOTOMY PAIR STRL (DRAPES) ×2 IMPLANT
LIGASURE LAP L-HOOKWIRE 5 44CM (INSTRUMENTS) IMPLANT
LIQUID BAND (GAUZE/BANDAGES/DRESSINGS) ×2 IMPLANT
NEEDLE INSUFFLATION 120MM (ENDOMECHANICALS) ×2 IMPLANT
NS IRRIG 1000ML POUR BTL (IV SOLUTION) ×2 IMPLANT
PACK LAVH (CUSTOM PROCEDURE TRAY) ×2 IMPLANT
PACK ROBOTIC GOWN (GOWN DISPOSABLE) ×2 IMPLANT
PAD TRENDELENBURG POSITION (MISCELLANEOUS) ×2 IMPLANT
SET IRRIG TUBING LAPAROSCOPIC (IRRIGATION / IRRIGATOR) IMPLANT
SLEEVE XCEL OPT CAN 5 100 (ENDOMECHANICALS) ×2 IMPLANT
SOLUTION ELECTROLUBE (MISCELLANEOUS) IMPLANT
STRIP CLOSURE SKIN 1/4X3 (GAUZE/BANDAGES/DRESSINGS) IMPLANT
SUT MNCRL 0 MO-4 VIOLET 18 CR (SUTURE) ×2 IMPLANT
SUT MNCRL 0 VIOLET 6X18 (SUTURE) ×1 IMPLANT
SUT MNCRL AB 0 CT1 27 (SUTURE) IMPLANT
SUT MON AB 2-0 CT1 36 (SUTURE) IMPLANT
SUT MONOCRYL 0 6X18 (SUTURE) ×1
SUT MONOCRYL 0 MO 4 18  CR/8 (SUTURE) ×2
SUT VICRYL 0 UR6 27IN ABS (SUTURE) ×2 IMPLANT
SUT VICRYL RAPIDE 3 0 (SUTURE) ×2 IMPLANT
TOWEL OR 17X24 6PK STRL BLUE (TOWEL DISPOSABLE) ×4 IMPLANT
TRAY FOLEY CATH SILVER 14FR (SET/KITS/TRAYS/PACK) ×2 IMPLANT
TROCAR OPTI TIP 5M 100M (ENDOMECHANICALS) ×2 IMPLANT
TROCAR XCEL DIL TIP R 11M (ENDOMECHANICALS) ×2 IMPLANT
WARMER LAPAROSCOPE (MISCELLANEOUS) ×2 IMPLANT
WATER STERILE IRR 1000ML POUR (IV SOLUTION) ×1 IMPLANT

## 2015-12-30 NOTE — Addendum Note (Signed)
Addendum  created 12/30/15 1523 by Raenette Rover, CRNA   Modules edited: Clinical Notes   Clinical Notes:  File: [id number]

## 2015-12-30 NOTE — Brief Op Note (Signed)
12/30/2015  9:12 AM  PATIENT:  Patricia Simpson  55 y.o. female  PRE-OPERATIVE DIAGNOSIS:  PELVIC PAIN, AUB, MENORRHAGIA, PROBABLE ADENOMYOSIS  POST-OPERATIVE DIAGNOSIS:  PELVIC PAIN, AUB, MENORRHAGIA, PROBABLE ADENOMYOSIS  PROCEDURE:  Procedure(s): LAPAROSCOPIC ASSISTED VAGINAL HYSTERECTOMY WITH SALPINGECTOMY (Bilateral)  SURGEON:  Surgeon(s) and Role:    * Louretta Shorten, MD - Primary    * Linda Hedges, DO - Assisting  PHYSICIAN ASSISTANT:   ASSISTANTS: Morris   ANESTHESIA:   local and general  EBL:  Total I/O In: 1000 [I.V.:1000] Out: 250 [Blood:250]  BLOOD ADMINISTERED:none  DRAINS: Urinary Catheter (Foley)   LOCAL MEDICATIONS USED:  MARCAINE     SPECIMEN:  Source of Specimen:  uterus and both tubes and ovaries  DISPOSITION OF SPECIMEN:  PATHOLOGY  COUNTS:  YES  TOURNIQUET:  * No tourniquets in log *  DICTATION: .Other Dictation: Dictation Number 1  PLAN OF CARE: Admit for overnight observation  PATIENT DISPOSITION:  PACU - hemodynamically stable.   Delay start of Pharmacological VTE agent (>24hrs) due to surgical blood loss or risk of bleeding: not applicable

## 2015-12-30 NOTE — Transfer of Care (Signed)
Immediate Anesthesia Transfer of Care Note  Patient: Patricia Simpson  Procedure(s) Performed: Procedure(s): LAPAROSCOPIC ASSISTED VAGINAL HYSTERECTOMY WITH SALPINGECTOMY (Bilateral)  Patient Location: PACU  Anesthesia Type:General  Level of Consciousness: awake, alert , oriented and patient cooperative  Airway & Oxygen Therapy: Patient Spontanous Breathing and Patient connected to nasal cannula oxygen  Post-op Assessment: Report given to RN and Post -op Vital signs reviewed and stable  Post vital signs: Reviewed and stable  Last Vitals:  Filed Vitals:   12/30/15 0641  BP: 138/78  Pulse: 63  Temp: 36.7 C  Resp: 16    Complications: No apparent anesthesia complications

## 2015-12-30 NOTE — Anesthesia Postprocedure Evaluation (Signed)
Anesthesia Post Note  Patient: Patricia Simpson  Procedure(s) Performed: Procedure(s) (LRB): LAPAROSCOPIC ASSISTED VAGINAL HYSTERECTOMY WITH SALPINGECTOMY (Bilateral)  Patient location during evaluation: PACU Anesthesia Type: General Level of consciousness: sedated Pain management: satisfactory to patient Vital Signs Assessment: post-procedure vital signs reviewed and stable Respiratory status: spontaneous breathing Cardiovascular status: stable Anesthetic complications: no    Last Vitals:  Filed Vitals:   12/30/15 1045 12/30/15 1246  BP:  117/64  Pulse: 75 82  Temp: 37 C   Resp: 16 12    Last Pain:  Filed Vitals:   12/30/15 1246  PainSc: 10-Worst pain ever                 Saket Hellstrom EDWARD

## 2015-12-30 NOTE — Anesthesia Postprocedure Evaluation (Signed)
Anesthesia Post Note  Patient: Patricia Simpson  Procedure(s) Performed: Procedure(s) (LRB): LAPAROSCOPIC ASSISTED VAGINAL HYSTERECTOMY WITH SALPINGECTOMY (Bilateral)  Patient location during evaluation: [facility] Anesthesia Type: General Level of consciousness: awake, awake and alert, oriented and patient cooperative Pain management: satisfactory to patient Vital Signs Assessment: post-procedure vital signs reviewed and stable Respiratory status: spontaneous breathing, nonlabored ventilation and respiratory function stable Cardiovascular status: stable Postop Assessment: no signs of nausea or vomiting Anesthetic complications: no    Last Vitals:  Filed Vitals:   12/30/15 1246 12/30/15 1348  BP: 117/64 106/54  Pulse: 82 88  Temp:    Resp: 12 13    Last Pain:  Filed Vitals:   12/30/15 1402  PainSc: 10-Worst pain ever                 Jessa Stinson L

## 2015-12-30 NOTE — Anesthesia Procedure Notes (Signed)
Procedure Name: Intubation Date/Time: 12/30/2015 7:44 AM Performed by: Tobin Chad Pre-anesthesia Checklist: Patient identified, Emergency Drugs available, Suction available and Patient being monitored Patient Re-evaluated:Patient Re-evaluated prior to inductionOxygen Delivery Method: Simple face mask and Non-rebreather mask Preoxygenation: Pre-oxygenation with 100% oxygen Intubation Type: IV induction Ventilation: Mask ventilation without difficulty Laryngoscope Size: Mac and 3 Grade View: Grade II Tube size: 7.0 mm Number of attempts: 1 Placement Confirmation: ETT inserted through vocal cords under direct vision,  positive ETCO2 and CO2 detector Secured at: 22 cm Tube secured with: Tape Dental Injury: Teeth and Oropharynx as per pre-operative assessment

## 2015-12-31 ENCOUNTER — Encounter (HOSPITAL_COMMUNITY): Payer: Self-pay | Admitting: Obstetrics and Gynecology

## 2015-12-31 DIAGNOSIS — N920 Excessive and frequent menstruation with regular cycle: Secondary | ICD-10-CM | POA: Diagnosis not present

## 2015-12-31 LAB — CBC
HCT: 26.9 % — ABNORMAL LOW (ref 36.0–46.0)
Hemoglobin: 8.7 g/dL — ABNORMAL LOW (ref 12.0–15.0)
MCH: 30.3 pg (ref 26.0–34.0)
MCHC: 32.3 g/dL (ref 30.0–36.0)
MCV: 93.7 fL (ref 78.0–100.0)
Platelets: 219 10*3/uL (ref 150–400)
RBC: 2.87 MIL/uL — ABNORMAL LOW (ref 3.87–5.11)
RDW: 13.5 % (ref 11.5–15.5)
WBC: 9.9 10*3/uL (ref 4.0–10.5)

## 2015-12-31 MED ORDER — HYDROMORPHONE HCL 2 MG PO TABS: ORAL_TABLET | ORAL | Status: DC

## 2015-12-31 MED ORDER — HYDROMORPHONE HCL 2 MG PO TABS
2.0000 mg | ORAL_TABLET | ORAL | Status: DC | PRN
  Administered 2015-12-31: 2 mg via ORAL
  Filled 2015-12-31: qty 1

## 2015-12-31 MED ORDER — PNEUMOCOCCAL VAC POLYVALENT 25 MCG/0.5ML IJ INJ
0.5000 mL | INJECTION | Freq: Once | INTRAMUSCULAR | Status: AC
  Administered 2015-12-31: 0.5 mL via INTRAMUSCULAR
  Filled 2015-12-31: qty 0.5

## 2015-12-31 NOTE — Progress Notes (Signed)
Wasted 67mls Fentanyl from PCA, in med room sink. Witnessed by Bonnielee Haff, RN

## 2015-12-31 NOTE — Discharge Summary (Signed)
Physician Discharge Summary  Patient ID: BENETTA RUFFINO MRN: [id number] DOB/AGE: 12/05/1960 55 y.o.  Admit date: 12/30/2015 Discharge date: 12/31/2015  Admission Diagnoses:  Discharge Diagnoses:  Active Problems:   S/P BSO (bilateral salpingo-oophorectomy)   Discharged Condition: good  Hospital Course: Pt underwent an ucomplicated LAVH BSO with ebl 250cc.  Her post op course was unremarkable with quick return of bowel and bladder function.  On POD1 was tolerating regualr diet, passing flatus and pain well managed with oral meds and she desired d/c home  Consults: None  Significant Diagnostic Studies: labs: post op Hgb 8.7  Treatments: surgery: LAVH, BSO  Discharge Exam: Blood pressure 119/82, pulse 70, temperature 98.2 F (36.8 C), temperature source Oral, resp. rate 16, height 5\' 4"  (1.626 m), weight 130 lb (58.968 kg), last menstrual period 12/09/2015, SpO2 98 %. General appearance: alert, cooperative, appears stated age and no distress GI: soft, non-tender; bowel sounds normal; no masses,  no organomegaly Incision/Wound:CD&I  Disposition: 01-Home or Self Care  Discharge Instructions    Call MD for:  difficulty breathing, headache or visual disturbances    Complete by:  As directed      Call MD for:  persistant nausea and vomiting    Complete by:  As directed      Call MD for:  redness, tenderness, or signs of infection (pain, swelling, redness, odor or green/yellow discharge around incision site)    Complete by:  As directed      Call MD for:  severe uncontrolled pain    Complete by:  As directed      Call MD for:  temperature >100.4    Complete by:  As directed      Diet general    Complete by:  As directed      Driving Restrictions    Complete by:  As directed   No driving for 1 week     Increase activity slowly    Complete by:  As directed      Lifting restrictions    Complete by:  As directed   No lifting anything greater than 10 pounds (if you have to ask,  don't lift it)     Sexual Activity Restrictions    Complete by:  As directed   Nothing in the vagina for 6 weeks            Medication List    STOP taking these medications        DASETTA 1/35 tablet  Generic drug:  norethindrone-ethinyl estradiol 1/35      TAKE these medications        albuterol 108 (90 Base) MCG/ACT inhaler  Commonly known as:  PROVENTIL HFA;VENTOLIN HFA  Inhale 2 puffs into the lungs every 6 (six) hours as needed for wheezing or shortness of breath.     ALPRAZolam 0.5 MG tablet  Commonly known as:  XANAX  Take 0.5 mg by mouth daily as needed for anxiety.     ARNUITY ELLIPTA 100 MCG/ACT Aepb  Generic drug:  Fluticasone Furoate  Inhale 1 puff into the lungs daily.     AZOR 5-20 MG tablet  Generic drug:  amLODipine-olmesartan  Take 1 tablet by mouth at bedtime.     DEXILANT 60 MG capsule  Generic drug:  dexlansoprazole  Take 1 capsule by mouth daily.     furosemide 20 MG tablet  Commonly known as:  LASIX  Take 10 mg by mouth daily.     gabapentin 300 MG capsule  Commonly known as:  NEURONTIN  Take 1 capsule (300 mg total) by mouth 2 (two) times daily.     HYDROmorphone 2 MG tablet  Commonly known as:  DILAUDID  One half tablet or one tablet every 3 hours as needed for pain     ibuprofen 600 MG tablet  Commonly known as:  ADVIL,MOTRIN  Take 600 mg by mouth every 6 (six) hours as needed for moderate pain.     nebivolol 10 MG tablet  Commonly known as:  BYSTOLIC  Take 10 mg by mouth at bedtime.     oxybutynin 5 MG 24 hr tablet  Commonly known as:  DITROPAN-XL  Take 1 tablet by mouth daily.     topiramate 25 MG capsule  Commonly known as:  TOPAMAX  Take 25 mg by mouth at bedtime as needed (migraines).      ASK your doctor about these medications        fluticasone 50 MCG/ACT nasal spray  Commonly known as:  FLONASE  Place 1 spray into both nostrils daily as needed for allergies or rhinitis.         Signed: Samantha Olivera  C 12/31/2015, 8:14 AM

## 2015-12-31 NOTE — Progress Notes (Signed)
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
## 2016-04-08 ENCOUNTER — Other Ambulatory Visit: Payer: Self-pay

## 2016-04-08 DIAGNOSIS — I10 Essential (primary) hypertension: Secondary | ICD-10-CM | POA: Insufficient documentation

## 2016-04-08 DIAGNOSIS — K219 Gastro-esophageal reflux disease without esophagitis: Secondary | ICD-10-CM | POA: Insufficient documentation

## 2016-04-08 DIAGNOSIS — G250 Essential tremor: Secondary | ICD-10-CM | POA: Insufficient documentation

## 2016-04-09 ENCOUNTER — Ambulatory Visit (INDEPENDENT_AMBULATORY_CARE_PROVIDER_SITE_OTHER): Payer: BC Managed Care – PPO | Admitting: Pulmonary Disease

## 2016-04-09 ENCOUNTER — Encounter (INDEPENDENT_AMBULATORY_CARE_PROVIDER_SITE_OTHER): Payer: Self-pay

## 2016-04-09 ENCOUNTER — Encounter: Payer: Self-pay | Admitting: Pulmonary Disease

## 2016-04-09 VITALS — BP 152/84 | HR 91 | Ht 64.0 in | Wt 129.2 lb

## 2016-04-09 DIAGNOSIS — R0600 Dyspnea, unspecified: Secondary | ICD-10-CM | POA: Insufficient documentation

## 2016-04-09 LAB — NITRIC OXIDE: Nitric Oxide: 5
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
## 2016-04-09 NOTE — Patient Instructions (Addendum)
We will schedule your for PFT's. Stop the Arnuity and start Breo 1 puff every morning. FENO in the office today. FENO Score was 3 CBC with Diff. Allergy blood panel CXR today. Follow up in  1 month after PFT's completed with Dr. Vaughan Browner. Please contact office for sooner follow up if symptoms do not improve or worsen or seek emergency care

## 2016-04-09 NOTE — Assessment & Plan Note (Addendum)
Consultation for dyspnea Plan: We will schedule your for PFT's. Stop the Arnuity and start Breo 1 puff every morning. FENO in the office today. CBC with Diff. Allergy blood panel  Add Zantac at bedtime for heart burn CXR today. Follow up in  1 month after PFT's completed with Dr. Vaughan Browner. Please contact office for sooner follow up if symptoms do not improve or worsen or seek emergency care

## 2016-04-12 ENCOUNTER — Ambulatory Visit (INDEPENDENT_AMBULATORY_CARE_PROVIDER_SITE_OTHER): Payer: BC Managed Care – PPO | Admitting: Pulmonary Disease

## 2016-04-12 ENCOUNTER — Other Ambulatory Visit (INDEPENDENT_AMBULATORY_CARE_PROVIDER_SITE_OTHER): Payer: BC Managed Care – PPO

## 2016-04-12 ENCOUNTER — Ambulatory Visit (INDEPENDENT_AMBULATORY_CARE_PROVIDER_SITE_OTHER)
Admission: RE | Admit: 2016-04-12 | Discharge: 2016-04-12 | Disposition: A | Discharge status: Home / Self Care | Payer: BC Managed Care – PPO | Source: Ambulatory Visit | Attending: Acute Care | Admitting: Acute Care

## 2016-04-12 DIAGNOSIS — R0600 Dyspnea, unspecified: Secondary | ICD-10-CM

## 2016-04-12 LAB — PULMONARY FUNCTION TEST
DL/VA % pred: 103 %
DL/VA: 5.05 ml/min/mmHg/L
DLCO COR % PRED: 86 %
DLCO COR: 21.65 ml/min/mmHg
DLCO UNC % PRED: 75 %
DLCO UNC: 18.9 ml/min/mmHg
FEF 25-75 POST: 2.76 L/s
FEF 25-75 PRE: 3.26 L/s
FEF2575-%Change-Post: -15 %
FEF2575-%PRED-POST: 118 %
FEF2575-%Pred-Pre: 139 %
FEV1-%Change-Post: -3 %
FEV1-%PRED-POST: 110 %
FEV1-%Pred-Pre: 114 %
FEV1-Post: 2.52 L
FEV1-Pre: 2.61 L
FEV1FVC-%Change-Post: -6 %
FEV1FVC-%PRED-PRE: 108 %
FEV6-%Change-Post: 3 %
FEV6-%PRED-POST: 110 %
FEV6-%Pred-Pre: 106 %
FEV6-POST: 3.07 L
FEV6-Pre: 2.95 L
FEV6FVC-%PRED-POST: 103 %
FEV6FVC-%Pred-Pre: 103 %
FVC-%Change-Post: 2 %
FVC-%Pred-Post: 106 %
FVC-%Pred-Pre: 103 %
FVC-PRE: 2.98 L
FVC-Post: 3.07 L
POST FEV1/FVC RATIO: 82 %
PRE FEV1/FVC RATIO: 88 %
Post FEV6/FVC ratio: 100 %
Pre FEV6/FVC Ratio: 100 %
RV % pred: 122 %
RV: 2.31 L
TLC % pred: 98 %
TLC: 5.05 L

## 2016-04-12 LAB — CBC WITH DIFFERENTIAL/PLATELET
BASOS PCT: 0.6 % (ref 0.0–3.0)
Basophils Absolute: 0 10*3/uL (ref 0.0–0.1)
EOS PCT: 1 % (ref 0.0–5.0)
Eosinophils Absolute: 0.1 10*3/uL (ref 0.0–0.7)
HEMATOCRIT: 35.9 % — AB (ref 36.0–46.0)
HEMOGLOBIN: 12 g/dL (ref 12.0–15.0)
Lymphocytes Relative: 27.3 % (ref 12.0–46.0)
Lymphs Abs: 1.9 10*3/uL (ref 0.7–4.0)
MCHC: 33.4 g/dL (ref 30.0–36.0)
MCV: 91.5 fl (ref 78.0–100.0)
MONO ABS: 0.5 10*3/uL (ref 0.1–1.0)
Monocytes Relative: 7.5 % (ref 3.0–12.0)
NEUTROS ABS: 4.5 10*3/uL (ref 1.4–7.7)
Neutrophils Relative %: 63.6 % (ref 43.0–77.0)
PLATELETS: 264 10*3/uL (ref 150.0–400.0)
RBC: 3.92 Mil/uL (ref 3.87–5.11)
RDW: 14.9 % (ref 11.5–15.5)
WBC: 7.1 10*3/uL (ref 4.0–10.5)

## 2016-04-12 NOTE — Progress Notes (Signed)
PFT done today. 

## 2016-04-13 LAB — RESPIRATORY ALLERGY PROFILE REGION II ~~LOC~~
Allergen, D pternoyssinus,d7: <0.1 kU/L
Allergen, Mouse Urine Protein, e78: <0.1 kU/L
Alternaria Alternata: <0.1 kU/L
Aspergillus fumigatus, m3: <0.1 kU/L
Bermuda Grass: <0.1 kU/L
Cat Dander: <0.1 kU/L
Cladosporium Herbarum: <0.1 kU/L
Common Ragweed: <0.1 kU/L
D. farinae: <0.1 kU/L
IgE (Immunoglobulin E), Serum: 34 kU/L (ref <115)
Pecan/Hickory Tree IgE: <0.1 kU/L
Penicillium Notatum: <0.1 kU/L
Rough Pigweed  IgE: <0.1 kU/L
Timothy Grass: <0.1 kU/L

## 2016-04-14 ENCOUNTER — Other Ambulatory Visit: Payer: Self-pay | Admitting: Acute Care

## 2016-04-14 ENCOUNTER — Telehealth: Payer: Self-pay | Admitting: Acute Care

## 2016-04-14 DIAGNOSIS — R9389 Abnormal findings on diagnostic imaging of other specified body structures: Secondary | ICD-10-CM

## 2016-04-14 NOTE — Telephone Encounter (Signed)
I have called Ms. Ojeda with the results of the CXR we did 6/19. I explained that it is abnormal, and that there is a Patchy rounded airspace opacity in the anterior inferior aspect of the left upper lobe that likely reflects pneumonia Per the radiology reading. I reviewed the film with Dr. Lamonte Sakai, and we feel a CT chest with contrast is the best next step based on the appearance of the CXR. I have called the patient. We have ordered a BMET and CT chest as follow up. She verbalized understanding of the above and had no further questions.

## 2016-04-15 ENCOUNTER — Other Ambulatory Visit (INDEPENDENT_AMBULATORY_CARE_PROVIDER_SITE_OTHER): Payer: BC Managed Care – PPO

## 2016-04-15 DIAGNOSIS — R938 Abnormal findings on diagnostic imaging of other specified body structures: Secondary | ICD-10-CM | POA: Diagnosis not present

## 2016-04-15 DIAGNOSIS — R9389 Abnormal findings on diagnostic imaging of other specified body structures: Secondary | ICD-10-CM

## 2016-04-15 LAB — BASIC METABOLIC PANEL
BUN: 11 mg/dL (ref 6–23)
CHLORIDE: 103 meq/L (ref 96–112)
CO2: 29 mEq/L (ref 19–32)
CREATININE: 0.83 mg/dL (ref 0.40–1.20)
Calcium: 9.2 mg/dL (ref 8.4–10.5)
GFR: 92.18 mL/min (ref >60.00)
Glucose, Bld: 89 mg/dL (ref 70–99)
Potassium: 3.9 mEq/L (ref 3.5–5.1)
Sodium: 137 mEq/L (ref 135–145)

## 2016-04-19 ENCOUNTER — Other Ambulatory Visit: Payer: Self-pay | Admitting: Acute Care

## 2016-04-19 ENCOUNTER — Telehealth: Payer: Self-pay | Admitting: Acute Care

## 2016-04-19 ENCOUNTER — Ambulatory Visit (INDEPENDENT_AMBULATORY_CARE_PROVIDER_SITE_OTHER)
Admission: RE | Admit: 2016-04-19 | Discharge: 2016-04-19 | Disposition: A | Discharge status: Home / Self Care | Payer: BC Managed Care – PPO | Source: Ambulatory Visit | Attending: Acute Care | Admitting: Acute Care

## 2016-04-19 DIAGNOSIS — J189 Pneumonia, unspecified organism: Secondary | ICD-10-CM

## 2016-04-19 DIAGNOSIS — R9389 Abnormal findings on diagnostic imaging of other specified body structures: Secondary | ICD-10-CM

## 2016-04-19 DIAGNOSIS — R938 Abnormal findings on diagnostic imaging of other specified body structures: Secondary | ICD-10-CM

## 2016-04-19 DIAGNOSIS — R911 Solitary pulmonary nodule: Secondary | ICD-10-CM

## 2016-04-19 MED ORDER — IOPAMIDOL (ISOVUE-300) INJECTION 61%
80.0000 mL | Freq: Once | INTRAVENOUS | Status: AC | PRN
  Administered 2016-04-19: 80 mL via INTRAVENOUS

## 2016-04-19 MED ORDER — LEVOFLOXACIN 500 MG PO TABS: 500.0000 mg | ORAL_TABLET | Freq: Every day | ORAL | Status: AC

## 2016-04-19 NOTE — Telephone Encounter (Signed)
I have called the results of the CT scan to Patricia Simpson. I explained that there was a solitary pulmonary nodule  In her left upper lobe that may be pneumonia. I told her I was calling in a prescription for Levaquin 500 mg. Once daily for 7 days. I told her I would also order a PET scan for 2 weeks from now  to help [place] further evaluate the nodule once we have treated her with a course of antibiotics. She verbalized understanding and had no further questions.

## 2016-04-20 ENCOUNTER — Telehealth: Payer: Self-pay | Admitting: Acute Care

## 2016-04-20 MED ORDER — FLUTICASONE FUROATE-VILANTEROL 100-25 MCG/INH IN AEPB: 1.0000 | INHALATION_SPRAY | Freq: Every day | RESPIRATORY_TRACT | Status: DC

## 2016-04-20 NOTE — Telephone Encounter (Signed)
I have called the lab results to the patient. I explained that her BMET was normal. She verbalized understanding and had no further questions.

## 2016-04-20 NOTE — Telephone Encounter (Signed)
Spoke with pt and advised that labs have not been resulted yet. Rx for [facility] sent to [facility]. Advised pt that we will call her once labs have been resulted by SG. Pt also asked about PFT and was advised that PM noted it was normal.   SG please result pt labs. Thanks!

## 2016-04-21 NOTE — Progress Notes (Signed)
Quick Note:  Called spoke with patient and advised of PFT results per PM. Pt voiced her understanding and denied any questions/concerns. ______

## 2016-05-04 ENCOUNTER — Telehealth: Payer: Self-pay | Admitting: Pulmonary Disease

## 2016-05-04 NOTE — Telephone Encounter (Signed)
Called and spoke with pt and she stated that she has all the information that she needed for the procedure that she is scheduled for.    Pt did have one other question about her PET scan.  She stated that her co-worker is expecting and she does not want to put anyone else in harms way with any meds that she will need to take for the PET scan. Judson Roch can you give any feedback for the pt on this.  thanks

## 2016-05-04 NOTE — Telephone Encounter (Signed)
Please call Patricia Simpson and let her know that the radio-isotope used has a very short half life and should not impose a danger to her co- worker per Dr. Annamaria Boots. Thanks so much.

## 2016-05-05 ENCOUNTER — Ambulatory Visit (HOSPITAL_COMMUNITY)
Admission: RE | Admit: 2016-05-05 | Discharge: 2016-05-05 | Disposition: A | Discharge status: Home / Self Care | Payer: BC Managed Care – PPO | Source: Ambulatory Visit | Attending: Acute Care | Admitting: Acute Care

## 2016-05-05 DIAGNOSIS — R911 Solitary pulmonary nodule: Secondary | ICD-10-CM | POA: Insufficient documentation

## 2016-05-05 LAB — GLUCOSE, CAPILLARY: Glucose-Capillary: 73 mg/dL (ref 65–99)

## 2016-05-05 MED ORDER — FLUDEOXYGLUCOSE F - 18 (FDG) INJECTION
6.3200 | Freq: Once | INTRAVENOUS | Status: AC | PRN
  Administered 2016-05-05: 6.32 via INTRAVENOUS

## 2016-05-05 NOTE — Telephone Encounter (Signed)
Spoke with pt,aware of recs.  Nothing further needed.  

## 2016-05-07 ENCOUNTER — Telehealth: Payer: Self-pay | Admitting: Pulmonary Disease

## 2016-05-07 ENCOUNTER — Telehealth: Payer: Self-pay | Admitting: Acute Care

## 2016-05-07 ENCOUNTER — Other Ambulatory Visit: Payer: Self-pay | Admitting: Acute Care

## 2016-05-07 DIAGNOSIS — R911 Solitary pulmonary nodule: Secondary | ICD-10-CM

## 2016-05-07 DIAGNOSIS — J189 Pneumonia, unspecified organism: Secondary | ICD-10-CM

## 2016-05-07 MED ORDER — LEVOFLOXACIN 750 MG PO TABS: 750.0000 mg | ORAL_TABLET | Freq: Every day | ORAL | Status: AC

## 2016-05-07 NOTE — Telephone Encounter (Signed)
I have called Ms. Menges with the results of her PET scan. I explained that the nodule seen on the initial CT scan was still there, but that there had been an interval decrease in its size since treatment with Levaquin.. I explained that I consulted with both Dr. Weber Cooks and Dr. Vaughan Browner regarding the best plan moving forward. Both felt that treating Ms. Viscomi with an additional round of antibiotic and repeating the CT scan to confirm continued resolution  would be the most appropriate course to take. I explained this to Ms. Falwell, who verbalized understanding and was in agreement with the plan. I have sent a prescription for Levaquin 750 x 7 days, and will order a CT to be done June 14, 2016. She has verbalized understanding and had no further questions upon completion of the call.Marland Kitchen   PET 99991111 Hypermetabolic pulmonary nodule in the lingula shows interval decrease in size compared to recent CT, suggesting a resolving infectious or inflammatory process. Followup chest CT recommended in 2-3 months to confirm continued resolution

## 2016-05-07 NOTE — Telephone Encounter (Signed)
I discussed the PET scan and follow up with Eric Form. She will call the patient today to update her. Thanks

## 2016-05-07 NOTE — Telephone Encounter (Signed)
Spoke with pt, c/o chest pain worsening X1 week- states she was given levaquin for this in the past which helped.  Pain worse with exertion.   Also notes fatigue, SOB, difficulty swallowing, feels like food is getting stuck in throat.  Denies cough, mucus production, fever.  Pt also requesting PET scan results.    Pt uses [facility] in [place].  PM please advise.  Thanks!

## 2016-05-07 NOTE — Telephone Encounter (Signed)
SG please advise when this has been discussed with pt.  Thanks!

## 2016-05-07 NOTE — Telephone Encounter (Signed)
See other 7/14 phone note.  Nothing further needed.

## 2016-05-17 ENCOUNTER — Telehealth: Payer: Self-pay | Admitting: Pulmonary Disease

## 2016-05-17 NOTE — Telephone Encounter (Signed)
Called spoke with patient who reported that her symptoms had improved after last ov, but she woke up this morning with a stabbing pain "deep inside her chest wall".  Discomfort comes and goes.  Feels unable to take deep breaths, when she does she begins to have this pain.  Feels as though there is a weight sitting in the center of her chest.  Denied any cough, congestion, wheezing, f/c/s.  Pt will be leaving at the end of the week x1 week Pt will be at work from 6-10pm today  Dr Vaughan Browner please advise, thank you.

## 2016-05-21 NOTE — Telephone Encounter (Signed)
lmtcb X1 for pt to schedule ov with PM

## 2016-05-21 NOTE — Telephone Encounter (Signed)
Pt scheduled with TP on Tuesday at 11:15.  Nothing further needed.

## 2016-05-21 NOTE — Telephone Encounter (Signed)
Pt says she thinks there is no resolution and doesn't really feel that coming in would help her. Not being on the antibiotics is still having problems. Not sure what to do next. She is out of town right now. Not sure what else can be done.

## 2016-05-21 NOTE — Telephone Encounter (Signed)
I cannot evaluate unless she is examined. Can you have her come to office at next available for evaluation. Ok to [place]. If symptoms are worsening then I advise her to go to ED.

## 2016-05-25 ENCOUNTER — Ambulatory Visit (INDEPENDENT_AMBULATORY_CARE_PROVIDER_SITE_OTHER): Payer: BC Managed Care – PPO | Admitting: Adult Health

## 2016-05-25 ENCOUNTER — Encounter: Payer: Self-pay | Admitting: Adult Health

## 2016-05-25 DIAGNOSIS — R0600 Dyspnea, unspecified: Secondary | ICD-10-CM | POA: Diagnosis not present

## 2016-05-25 DIAGNOSIS — R911 Solitary pulmonary nodule: Secondary | ICD-10-CM | POA: Insufficient documentation

## 2016-05-25 NOTE — Assessment & Plan Note (Signed)
LUL nodule 99991111 , hypermetabolic on PET but decreased in size s/p abx , c/w infectious /inflammatory in nature  Would continue to follow for clearance on serial CT chest  This has been scheduled in august with follow up with dr. Vaughan Browner.

## 2016-05-25 NOTE — Patient Instructions (Addendum)
Follow up for CT chest as planned. follow up Dr. Vaughan Browner in 2 months and As needed   Follow up with PCP for Rheumatology referral as discussed.  Please contact office for sooner follow up if symptoms do not improve or worsen or seek emergency care
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
## 2016-05-25 NOTE — Assessment & Plan Note (Signed)
Chronic dyspnea /atypical chest pain ? Etiology  She has had an extensive workup with reported neg cardiac work up .  FENO 3, no response with BREO .  PFT w/ normal lung fnx. CBC w/ nml eosinophils.  RAST nml , IgE nml.  She does have some abn change on CT that will need further evalution , some scarring in apices and decreasing size of LUL nodule. ? Infectious . Not sure this would explain pain or dyspnea as this has been chronic for >1 year.  She does have diffuse arthralgias that needs further workup to r/o possible autoimmune disorder.

## 2016-06-15 ENCOUNTER — Other Ambulatory Visit: Payer: BC Managed Care – PPO

## 2016-06-21 ENCOUNTER — Ambulatory Visit: Payer: BC Managed Care – PPO | Admitting: Pulmonary Disease

## 2016-07-20 ENCOUNTER — Inpatient Hospital Stay: Admission: RE | Admit: 2016-07-20 | Payer: BC Managed Care – PPO | Source: Ambulatory Visit

## 2016-07-27 ENCOUNTER — Ambulatory Visit: Payer: BC Managed Care – PPO | Admitting: Pulmonary Disease

## 2016-08-02 ENCOUNTER — Other Ambulatory Visit: Payer: Self-pay | Admitting: Acute Care

## 2016-08-02 ENCOUNTER — Ambulatory Visit (INDEPENDENT_AMBULATORY_CARE_PROVIDER_SITE_OTHER)
Admission: RE | Admit: 2016-08-02 | Discharge: 2016-08-02 | Disposition: A | Discharge status: Home / Self Care | Payer: BC Managed Care – PPO | Source: Ambulatory Visit | Attending: Acute Care | Admitting: Acute Care

## 2016-08-02 DIAGNOSIS — R911 Solitary pulmonary nodule: Secondary | ICD-10-CM | POA: Diagnosis not present

## 2016-08-03 ENCOUNTER — Telehealth: Payer: Self-pay | Admitting: Acute Care

## 2016-08-03 NOTE — Telephone Encounter (Signed)
I called Patricia Simpson with the results of her CT scan. I explained that the scan indicated near complete resolution of the previously noted infectious/ inflammatory nodule in the lingula . I confirmed her appointment with Dr. Vaughan Browner 08/10/2016 for further follow up. She verbalized understanding and had no further questions upon completion of the call.

## 2016-08-10 ENCOUNTER — Encounter: Payer: Self-pay | Admitting: Pulmonary Disease

## 2016-08-10 ENCOUNTER — Ambulatory Visit (INDEPENDENT_AMBULATORY_CARE_PROVIDER_SITE_OTHER): Payer: BC Managed Care – PPO | Admitting: Pulmonary Disease

## 2016-08-10 VITALS — BP 118/72 | HR 75 | Ht 65.0 in | Wt 126.6 lb

## 2016-08-10 DIAGNOSIS — R0600 Dyspnea, unspecified: Secondary | ICD-10-CM

## 2016-08-10 DIAGNOSIS — R911 Solitary pulmonary nodule: Secondary | ICD-10-CM | POA: Diagnosis not present

## 2016-08-10 NOTE — Patient Instructions (Signed)
We will schedule you for a CT chest in 6 months. Follow in clinic after to review
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
## 2016-09-24 ENCOUNTER — Ambulatory Visit (INDEPENDENT_AMBULATORY_CARE_PROVIDER_SITE_OTHER): Payer: BC Managed Care – PPO | Admitting: Pulmonary Disease

## 2016-09-24 ENCOUNTER — Encounter: Payer: Self-pay | Admitting: Pulmonary Disease

## 2016-09-24 DIAGNOSIS — R0602 Shortness of breath: Secondary | ICD-10-CM

## 2016-09-24 DIAGNOSIS — Z23 Encounter for immunization: Secondary | ICD-10-CM

## 2016-09-24 MED ORDER — ALBUTEROL SULFATE HFA 108 (90 BASE) MCG/ACT IN AERS: 2.0000 | INHALATION_SPRAY | Freq: Four times a day (QID) | RESPIRATORY_TRACT | 3 refills | Status: DC | PRN
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
## 2016-09-24 NOTE — Patient Instructions (Signed)
Start using her albuterol rescue inhaler as needed. Will follow up on the CT scan scheduled for April 2018. Follow up in clinic after CT scan for review.

## 2017-02-01 ENCOUNTER — Ambulatory Visit (INDEPENDENT_AMBULATORY_CARE_PROVIDER_SITE_OTHER)
Admission: RE | Admit: 2017-02-01 | Discharge: 2017-02-01 | Disposition: A | Discharge status: Home / Self Care | Payer: BC Managed Care – PPO | Source: Ambulatory Visit | Attending: Pulmonary Disease | Admitting: Pulmonary Disease

## 2017-02-01 DIAGNOSIS — R911 Solitary pulmonary nodule: Secondary | ICD-10-CM

## 2017-02-02 ENCOUNTER — Telehealth: Payer: Self-pay | Admitting: Pulmonary Disease

## 2017-02-02 NOTE — Telephone Encounter (Signed)
Left detailed msg for pt explaining results not yet reviewed, and that we will call her once PM reviews them  Please advise on CT Chest done 02/01/17, thanks!

## 2017-02-02 NOTE — Telephone Encounter (Signed)
I called her phone number but got the voicemail. Please let her know that the CT does not show any acute abnormality in the lungs. There is no evidence of lung nodules or malignancy. There may be evidence of cirrhosis of liver. I would advice her to follow up with her primary care regarding this.

## 2017-02-02 NOTE — Telephone Encounter (Signed)
Pt aware of results and voiced her understanding. Nothing further needed.  

## 2017-02-03 ENCOUNTER — Telehealth: Payer: Self-pay | Admitting: Pulmonary Disease

## 2017-02-03 NOTE — Telephone Encounter (Signed)
Spoke with pt, who states during our convocation yesterday, she didn't mention her recent hx of hep C due to being at work. Pt has seen a liver specialist previously Dr. Heloise Beecham at [place]. Pt states she has been treated and cured of hep C in 2016. Her last OV with Dr. Heloise Beecham was 05/2016 and pt was instructed to f/u prn. Pt has requested copy of CT, which she was provided with, she also ask that we send a copy to Dr. Heloise Beecham. Copy has been faxed as well.  Pt states she is going to call Dr. Loraine Leriche office for an apt.  Will route to PM as an [place].

## 2017-02-21 ENCOUNTER — Other Ambulatory Visit (HOSPITAL_COMMUNITY): Payer: Self-pay | Admitting: Nurse Practitioner

## 2017-02-21 DIAGNOSIS — K7400 Hepatic fibrosis, unspecified: Secondary | ICD-10-CM

## 2017-02-21 DIAGNOSIS — B182 Chronic viral hepatitis C: Secondary | ICD-10-CM

## 2017-02-21 DIAGNOSIS — R932 Abnormal findings on diagnostic imaging of liver and biliary tract: Secondary | ICD-10-CM

## 2017-02-24 ENCOUNTER — Other Ambulatory Visit: Payer: Self-pay | Admitting: Radiology

## 2017-02-25 ENCOUNTER — Other Ambulatory Visit: Payer: Self-pay | Admitting: Radiology

## 2017-02-28 ENCOUNTER — Ambulatory Visit (HOSPITAL_COMMUNITY)
Admission: RE | Admit: 2017-02-28 | Discharge: 2017-02-28 | Disposition: A | Discharge status: Home / Self Care | Payer: BC Managed Care – PPO | Source: Ambulatory Visit | Attending: Nurse Practitioner | Admitting: Nurse Practitioner

## 2017-02-28 ENCOUNTER — Encounter (HOSPITAL_COMMUNITY): Payer: Self-pay

## 2017-02-28 DIAGNOSIS — B182 Chronic viral hepatitis C: Secondary | ICD-10-CM | POA: Diagnosis not present

## 2017-02-28 DIAGNOSIS — F419 Anxiety disorder, unspecified: Secondary | ICD-10-CM | POA: Insufficient documentation

## 2017-02-28 DIAGNOSIS — R918 Other nonspecific abnormal finding of lung field: Secondary | ICD-10-CM | POA: Diagnosis not present

## 2017-02-28 DIAGNOSIS — K219 Gastro-esophageal reflux disease without esophagitis: Secondary | ICD-10-CM | POA: Diagnosis not present

## 2017-02-28 DIAGNOSIS — K7400 Hepatic fibrosis, unspecified: Secondary | ICD-10-CM

## 2017-02-28 DIAGNOSIS — I10 Essential (primary) hypertension: Secondary | ICD-10-CM | POA: Diagnosis not present

## 2017-02-28 DIAGNOSIS — J449 Chronic obstructive pulmonary disease, unspecified: Secondary | ICD-10-CM | POA: Insufficient documentation

## 2017-02-28 DIAGNOSIS — G43909 Migraine, unspecified, not intractable, without status migrainosus: Secondary | ICD-10-CM | POA: Insufficient documentation

## 2017-02-28 DIAGNOSIS — J45909 Unspecified asthma, uncomplicated: Secondary | ICD-10-CM | POA: Diagnosis not present

## 2017-02-28 DIAGNOSIS — R932 Abnormal findings on diagnostic imaging of liver and biliary tract: Secondary | ICD-10-CM | POA: Diagnosis present

## 2017-02-28 DIAGNOSIS — I7300 Raynaud's syndrome without gangrene: Secondary | ICD-10-CM | POA: Diagnosis not present

## 2017-02-28 LAB — CBC WITH DIFFERENTIAL/PLATELET
Basophils Absolute: 0 10*3/uL (ref 0.0–0.1)
Basophils Relative: 0 %
EOS ABS: 0 10*3/uL (ref 0.0–0.7)
EOS PCT: 1 %
HCT: 34.5 % — ABNORMAL LOW (ref 36.0–46.0)
HEMOGLOBIN: 11.3 g/dL — AB (ref 12.0–15.0)
LYMPHS ABS: 2.6 10*3/uL (ref 0.7–4.0)
LYMPHS PCT: 34 %
MCH: 31.2 pg (ref 26.0–34.0)
MCHC: 32.8 g/dL (ref 30.0–36.0)
MCV: 95.3 fL (ref 78.0–100.0)
Monocytes Absolute: 0.4 10*3/uL (ref 0.1–1.0)
Monocytes Relative: 5 %
Neutro Abs: 4.5 10*3/uL (ref 1.7–7.7)
Neutrophils Relative %: 60 %
PLATELETS: 214 10*3/uL (ref 150–400)
RBC: 3.62 MIL/uL — AB (ref 3.87–5.11)
RDW: 13.8 % (ref 11.5–15.5)
WBC: 7.5 10*3/uL (ref 4.0–10.5)

## 2017-02-28 LAB — COMPREHENSIVE METABOLIC PANEL
ALT: 18 U/L (ref 14–54)
AST: 26 U/L (ref 15–41)
Albumin: 3.8 g/dL (ref 3.5–5.0)
Alkaline Phosphatase: 47 U/L (ref 38–126)
Anion gap: 6 (ref 5–15)
BUN: 15 mg/dL (ref 6–20)
CHLORIDE: 108 mmol/L (ref 101–111)
CO2: 27 mmol/L (ref 22–32)
CREATININE: 0.8 mg/dL (ref 0.44–1.00)
Calcium: 8.8 mg/dL — ABNORMAL LOW (ref 8.9–10.3)
GFR calc Af Amer: >60 mL/min (ref >60)
GFR calc non Af Amer: >60 mL/min (ref >60)
GLUCOSE: 94 mg/dL (ref 65–99)
Potassium: 3.8 mmol/L (ref 3.5–5.1)
SODIUM: 141 mmol/L (ref 135–145)
Total Bilirubin: 0.9 mg/dL (ref 0.3–1.2)
Total Protein: 7 g/dL (ref 6.5–8.1)

## 2017-02-28 LAB — PROTIME-INR
INR: 0.99
Prothrombin Time: 13.1 seconds (ref 11.4–15.2)

## 2017-02-28 MED ORDER — GELATIN ABSORBABLE 12-7 MM EX MISC
CUTANEOUS | Status: AC
  Filled 2017-02-28: qty 1

## 2017-02-28 MED ORDER — FENTANYL CITRATE (PF) 100 MCG/2ML IJ SOLN
INTRAMUSCULAR | Status: AC | PRN
  Administered 2017-02-28: 50 ug via INTRAVENOUS

## 2017-02-28 MED ORDER — LIDOCAINE-EPINEPHRINE 1 %-1:100000 IJ SOLN
INTRAMUSCULAR | Status: AC
  Filled 2017-02-28: qty 1

## 2017-02-28 MED ORDER — MIDAZOLAM HCL 2 MG/2ML IJ SOLN
INTRAMUSCULAR | Status: AC
  Filled 2017-02-28: qty 6

## 2017-02-28 MED ORDER — FENTANYL CITRATE (PF) 100 MCG/2ML IJ SOLN
INTRAMUSCULAR | Status: AC
  Filled 2017-02-28: qty 2

## 2017-02-28 MED ORDER — SODIUM CHLORIDE 0.9 % IV SOLN
INTRAVENOUS | Status: DC
  Administered 2017-02-28: 12:00:00 via INTRAVENOUS

## 2017-02-28 MED ORDER — MIDAZOLAM HCL 2 MG/2ML IJ SOLN
INTRAMUSCULAR | Status: AC | PRN
  Administered 2017-02-28 (×2): 1 mg via INTRAVENOUS
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
## 2017-02-28 NOTE — Discharge Instructions (Signed)
Liver Biopsy, Care After °Refer to this sheet in the next few weeks. These instructions provide you with information on caring for yourself after your procedure. Your health care provider may also give you more specific instructions. Your treatment has been planned according to current medical practices, but problems sometimes occur. Call your health care provider if you have any problems or questions after your procedure. °What can I expect after the procedure? °After your procedure, it is typical to have the following: °· A small amount of discomfort in the area where the biopsy was done and in the right shoulder or shoulder blade. °· A small amount of bruising around the area where the biopsy was done and on the skin over the liver. °· Sleepiness and fatigue for the rest of the day. °Follow these instructions at home: °· Rest at home for 1-2 days or as directed by your health care provider. °· Have a friend or family member stay with you for at least 24 hours. °· Because of the medicines used during the procedure, you should not do the following things in the first 24 hours: °¨ Drive. °¨ Use machinery. °¨ Be responsible for the care of other people. °¨ Sign legal documents. °¨ Take a bath or shower. °· There are many different ways to close and cover an incision, including stitches, skin glue, and adhesive strips. Follow your health care provider's instructions on: °¨ Incision care. °¨ Bandage (dressing) changes and removal. °¨ Incision closure removal. °· Do not drink alcohol in the first week. °· Do not lift more than 5 pounds or play contact sports for 2 weeks after this test. °· Take medicines only as directed by your health care provider. Do not take medicine containing aspirin or non-steroidal anti-inflammatory medicines such as ibuprofen for 1 week after this test. °· It is your responsibility to get your test results. °Contact a health care provider if: °· You have increased bleeding from an incision that  results in more than a small spot of blood. °· You have redness, swelling, or increasing pain in any incisions. °· You notice a discharge or a bad smell coming from any of your incisions. °· You have a fever or chills. °Get help right away if: °· You develop swelling, bloating, or pain in your abdomen. °· You become dizzy or faint. °· You develop a rash. °· You are nauseous or vomit. °· You have difficulty breathing, feel short of breath, or feel faint. °· You develop chest pain. °· You have problems with your speech or vision. °· You have trouble balancing or moving your arms or legs. °This information is not intended to replace advice given to you by your health care provider. Make sure you discuss any questions you have with your health care provider. °Document Released: 04/30/2005 Document Revised: 03/18/2016 Document Reviewed: 12/07/2013 °Elsevier Interactive Patient Education © 2017 [employer] ° ° ° ° °Moderate Conscious Sedation, Adult, Care After °These instructions provide you with information about caring for yourself after your procedure. Your health care provider may also give you more specific instructions. Your treatment has been planned according to current medical practices, but problems sometimes occur. Call your health care provider if you have any problems or questions after your procedure. °What can I expect after the procedure? °After your procedure, it is common: °· To feel sleepy for several hours. °· To feel clumsy and have poor balance for several hours. °· To have poor judgment for several hours. °· To vomit   if you eat too soon. °Follow these instructions at home: °For at least 24 hours after the procedure:  ° °· Do not: °¨ Participate in activities where you could fall or become injured. °¨ Drive. °¨ Use heavy machinery. °¨ Drink alcohol. °¨ Take sleeping pills or medicines that cause drowsiness. °¨ Make important decisions or sign legal documents. °¨ Take care of children on your  own. °· Rest. °Eating and drinking  °· Follow the diet recommended by your health care provider. °· If you vomit: °¨ Drink water, juice, or soup when you can drink without vomiting. °¨ Make sure you have little or no nausea before eating solid foods. °General instructions  °· Have a responsible adult stay with you until you are awake and alert. °· Take over-the-counter and prescription medicines only as told by your health care provider. °· If you smoke, do not smoke without supervision. °· Keep all follow-up visits as told by your health care provider. This is important. °Contact a health care provider if: °· You keep feeling nauseous or you keep vomiting. °· You feel light-headed. °· You develop a rash. °· You have a fever. °Get help right away if: °· You have trouble breathing. °This information is not intended to replace advice given to you by your health care provider. Make sure you discuss any questions you have with your health care provider. °Document Released: 08/01/2013 Document Revised: 03/15/2016 Document Reviewed: 01/31/2016 °Elsevier Interactive Patient Education © 2017 Elsevier Inc. ° °

## 2017-02-28 NOTE — Procedures (Signed)
Pre Procedure Dx: Elevated LFTs Post Procedural Dx: Same  Technically successful US guided biopsy of right lobe of the liver.  EBL: None  No immediate complications.   Jay Shamaine Mulkern, MD Pager #: [phone]    

## 2017-06-17 DIAGNOSIS — M4310 Spondylolisthesis, site unspecified: Secondary | ICD-10-CM | POA: Insufficient documentation

## 2018-02-02 ENCOUNTER — Ambulatory Visit: Payer: BC Managed Care – PPO

## 2018-02-02 ENCOUNTER — Ambulatory Visit: Payer: BC Managed Care – PPO | Admitting: Podiatry

## 2018-02-02 ENCOUNTER — Encounter: Payer: Self-pay | Admitting: Podiatry

## 2018-02-02 VITALS — BP 128/82 | HR 72 | Resp 16

## 2018-02-02 DIAGNOSIS — M2010 Hallux valgus (acquired), unspecified foot: Secondary | ICD-10-CM

## 2018-02-02 DIAGNOSIS — M2011 Hallux valgus (acquired), right foot: Secondary | ICD-10-CM
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
## 2018-02-02 NOTE — Patient Instructions (Signed)
Pre-Operative Instructions  Congratulations, you have decided to take an important step towards improving your quality of life.  You can be assured that the doctors and staff at [facility] will be with you every step of the way.  Here are some important things you should know:  1. Plan to be at the surgery center/hospital at least 1 (one) hour prior to your scheduled time, unless otherwise directed by the surgical center/hospital staff.  You must have a responsible adult accompany you, remain during the surgery and drive you home.  Make sure you have directions to the surgical center/hospital to ensure you arrive on time. 2. If you are having surgery at [facility] or [facility], you will need a copy of your medical history and physical form from your family physician within one month prior to the date of surgery. We will give you a form for your primary physician to complete.  3. We make every effort to accommodate the date you request for surgery.  However, there are times where surgery dates or times have to be moved.  We will contact you as soon as possible if a change in schedule is required.   4. No aspirin/ibuprofen for one week before surgery.  If you are on aspirin, any non-steroidal anti-inflammatory medications (Mobic, Aleve, Ibuprofen) should not be taken seven (7) days prior to your surgery.  You make take Tylenol for pain prior to surgery.  5. Medications - If you are taking daily heart and blood pressure medications, seizure, reflux, allergy, asthma, anxiety, pain or diabetes medications, make sure you notify the surgery center/hospital before the day of surgery so they can tell you which medications you should take or avoid the day of surgery. 6. No food or drink after midnight the night before surgery unless directed otherwise by surgical center/hospital staff. 7. No alcoholic beverages 24-hours prior to surgery.  No smoking 24-hours prior or 24-hours after  surgery. 8. Wear loose pants or shorts. They should be loose enough to fit over bandages, boots, and casts. 9. Don't wear slip-on shoes. Sneakers are preferred. 10. Bring your boot with you to the surgery center/hospital.  Also bring crutches or a walker if your physician has prescribed it for you.  If you do not have this equipment, it will be provided for you after surgery. 11. If you have not been contacted by the surgery center/hospital by the day before your surgery, call to confirm the date and time of your surgery. 12. Leave-time from work may vary depending on the type of surgery you have.  Appropriate arrangements should be made prior to surgery with your employer. 13. Prescriptions will be provided immediately following surgery by your doctor.  Fill these as soon as possible after surgery and take the medication as directed. Pain medications will not be refilled on weekends and must be approved by the doctor. 14. Remove nail polish on the operative foot and avoid getting pedicures prior to surgery. 15. Wash the night before surgery.  The night before surgery wash the foot and leg well with water and the antibacterial soap provided. Be sure to pay special attention to beneath the toenails and in between the toes.  Wash for at least three (3) minutes. Rinse thoroughly with water and dry well with a towel.  Perform this wash unless told not to do so by your physician.  Enclosed: 1 Ice pack (please put in freezer the night before surgery)   1 Hibiclens skin cleaner     Pre-op instructions  If you have any questions regarding the instructions, please do not hesitate to call our office.  [place]: [address], [place], [place] [address] -- [phone]  [place]: [address] [place], [place] [address] -- [phone]  [place]: [address]  [place], [place] [address] -- [phone]  [place]: [address], [address], [place], [place] [address] -- [phone]  Website:  [email or web address] 

## 2018-03-06 ENCOUNTER — Telehealth: Payer: Self-pay | Admitting: *Deleted

## 2018-03-06 NOTE — Telephone Encounter (Signed)
"  I need to find out if you have me scheduled for surgery on June 14.  I haven't heard anything.  I don't know how much I will owe.  I don't know what time I will need to be there"  I will get Jocelyn Lamer in our insurance department to give you a call with an estimate.  You will have to call the surgical center to get their estimate as well as anesthesia.  Your surgery will take one hour and 45 minutes for completion.  Someone from the surgical center will call you and give you your arrival time a day or two prior to your surgery date.  I can't give you a time because they may have cancellations, have diabetic patients, or children that have to go first.  "I understand.  How do I contact the surgery center?"  You should have a brochure in the surgery kit we gave you.  It has the phone number on the back of it.  However, their phone number is [phone].  ([place] Bunionectomy, Hammer Toe Repair 4,5 and Tenotomy Single Flexor 2,3 right)

## 2018-03-06 NOTE — Telephone Encounter (Signed)
Pt states the toe next to her other toe is in cringing on the next toe making it swell, what could she do until her surgery in June. I told pt, I could give her a silicone sleeve to help keep from the toes rubbing and pt states she will come by afternoon.

## 2018-03-07 ENCOUNTER — Encounter: Payer: Self-pay | Admitting: Podiatry

## 2018-03-07 ENCOUNTER — Ambulatory Visit: Payer: BC Managed Care – PPO | Admitting: Podiatry

## 2018-03-07 DIAGNOSIS — M7750 Other enthesopathy of unspecified foot: Secondary | ICD-10-CM | POA: Diagnosis not present

## 2018-03-07 DIAGNOSIS — M778 Other enthesopathies, not elsewhere classified: Secondary | ICD-10-CM

## 2018-03-07 DIAGNOSIS — M779 Enthesopathy, unspecified: Principal | ICD-10-CM

## 2018-03-07 NOTE — Progress Notes (Signed)
She presents today for follow-up of toes #2 number 3-second toe swelling of the third toe is tender.  Objective: Vital signs are stable she is alert and oriented x3 she has pain on palpation of the second PIPJ right foot.  Mild edema is noticed there is no erythema cellulitis drainage or odor.  Assessment: Hallux valgus resulting in capsulitis of the second PIPJ due to juxtaposition right foot.  Plan: Injected 2 mg of dexamethasone and local anesthetic to the point of maximal tenderness second PIPJ after alcohol skin prep.  Tolerated procedure well without complications follow-up with her in June for surgery.  This will consist of hallux abductovalgus repair as well as hammertoe repair #2 #3 #4 #5

## 2018-03-07 NOTE — Telephone Encounter (Signed)
Pt presents to the office to be fitted for silicone digital toe cap. Pt points to right 2nd and 4th toes stating they are painful, bruised feeling, which is increasing. I fitted pt for a silicone toe cap for right 2nd and 4th toes, and encouraged pt to make an appt to see Dr. Milinda Pointer prior to June surgery for evaluation of the toes for surgery. Pt states understanding and set up an appt when leaving.

## 2018-03-08 ENCOUNTER — Encounter

## 2018-03-08 ENCOUNTER — Ambulatory Visit: Payer: BC Managed Care – PPO | Admitting: Diagnostic Neuroimaging

## 2018-03-08 ENCOUNTER — Encounter: Payer: Self-pay | Admitting: Diagnostic Neuroimaging

## 2018-03-08 VITALS — BP 122/72 | HR 67 | Ht 64.5 in | Wt 117.8 lb

## 2018-03-08 DIAGNOSIS — G250 Essential tremor: Secondary | ICD-10-CM

## 2018-03-08 DIAGNOSIS — G8929 Other chronic pain: Secondary | ICD-10-CM | POA: Diagnosis not present

## 2018-03-08 DIAGNOSIS — M5442 Lumbago with sciatica, left side: Secondary | ICD-10-CM | POA: Diagnosis not present

## 2018-03-08 DIAGNOSIS — M5441 Lumbago with sciatica, right side: Secondary | ICD-10-CM

## 2018-03-08 DIAGNOSIS — R269 Unspecified abnormalities of gait and mobility: Secondary | ICD-10-CM | POA: Diagnosis not present

## 2018-03-08 MED ORDER — PRIMIDONE 50 MG PO TABS: 25.0000 mg | ORAL_TABLET | Freq: Every day | ORAL | 5 refills | Status: DC
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
## 2018-03-08 NOTE — Patient Instructions (Signed)
ESSENTIAL TREMOR - check MRI brain - trial of primidone 25mg  at bedtime; may increase to 50mg  at bedtime or twice a day   NUMBNESS / BALANCE / LOW BACK PAIN - recommend PT evaluation after right foot surgery

## 2018-03-09 ENCOUNTER — Telehealth: Payer: Self-pay | Admitting: Diagnostic Neuroimaging

## 2018-03-09 NOTE — Telephone Encounter (Signed)
Pt has returned the call to Emily, she is asking for a call back °

## 2018-03-09 NOTE — Telephone Encounter (Signed)
Spoke to the patient and she is scheduled for 03/19/18 on the [facility] mobile unit.

## 2018-03-09 NOTE — Telephone Encounter (Signed)
[employer] Auth: [id number] (exp. 03/09/18 to 04/07/18)  lvm for pt to call back about scheduling mri

## 2018-03-22 ENCOUNTER — Other Ambulatory Visit: Payer: BC Managed Care – PPO

## 2018-03-25 IMAGING — CT CT CHEST W/ CM
2 of 3 series · 15 of 36 positions shown, 18 images · IV contrast (ISOVUE 300)
Comparison: Chest radiograph 04/12/2016.

CLINICAL DATA: Patient with history of abnormal chest radiograph.
Shortness of breath.

EXAM:
CT CHEST WITH CONTRAST
TECHNIQUE: Multidetector CT imaging of the chest was performed during
intravenous contrast administration.
CONTRAST:  80mL XMIGG8-0JJ IOPAMIDOL (XMIGG8-0JJ) INJECTION 61%

[Series 2: thorax · axial · 0.64mm/px · z∈[-304,-20]mm · 12 of 168 slices shown, 15 images]
[im 13/168  mediastinal]
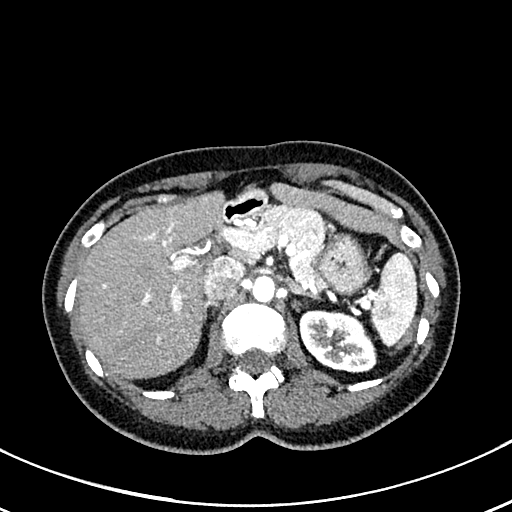
[im 13/168  lung]
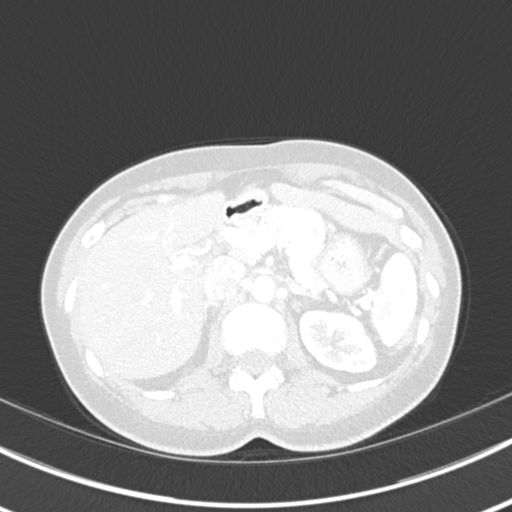
[im 25/168  lung]
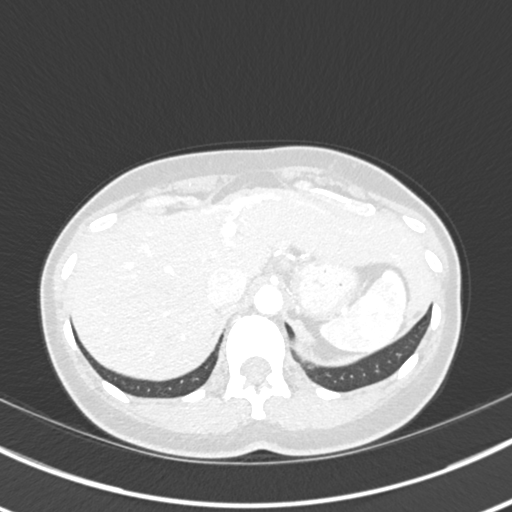
[im 38/168  lung]
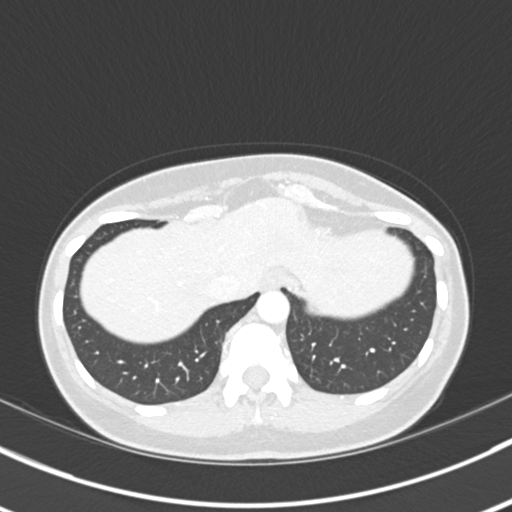
[im 50/168  lung]
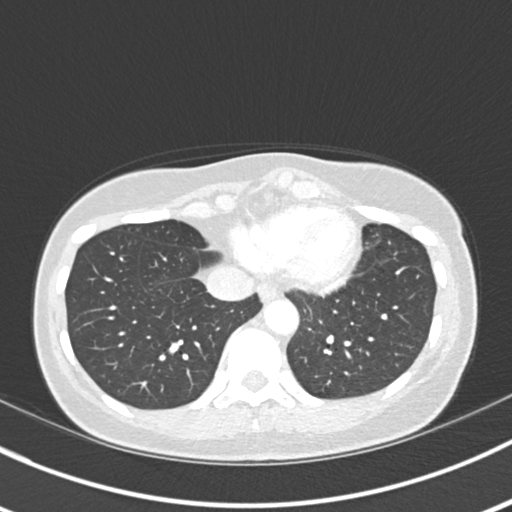
[im 62/168  mediastinal]
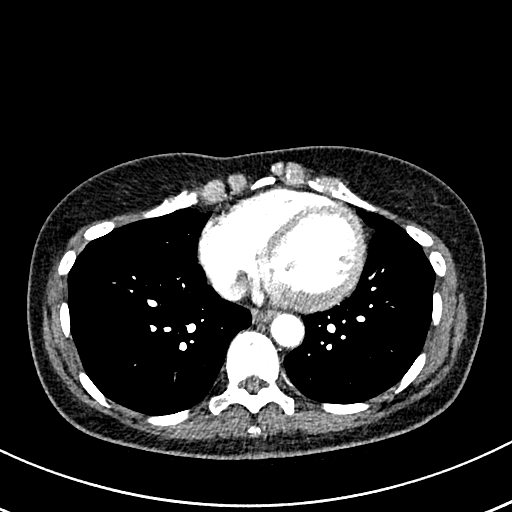
[im 62/168  lung]
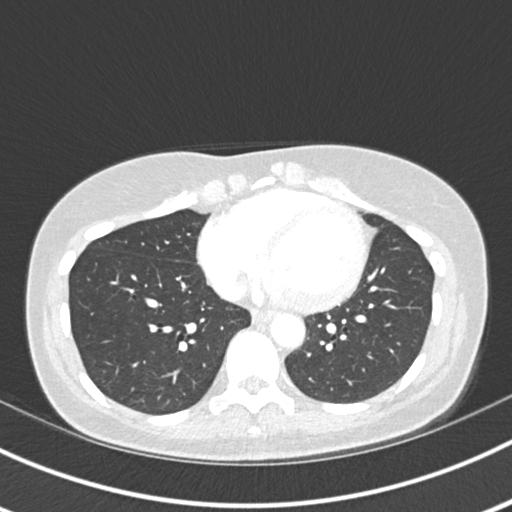
[im 75/168  lung]
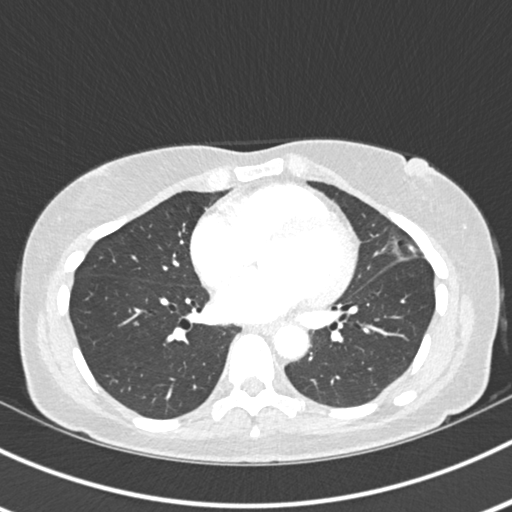
[im 93/168  lung]
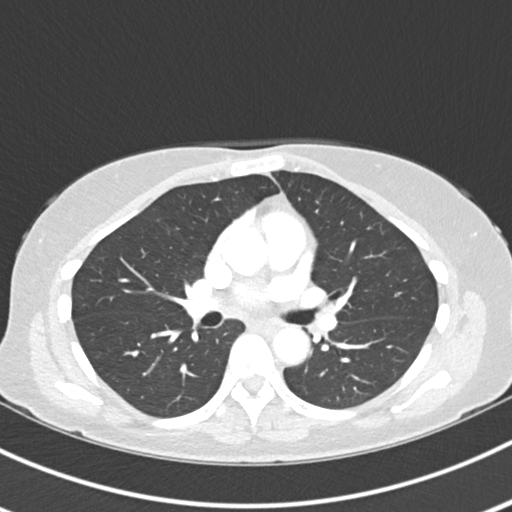
[im 106/168  lung]
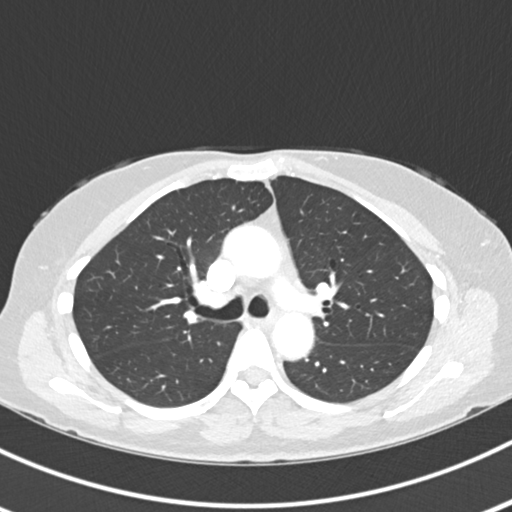
[im 118/168  mediastinal]
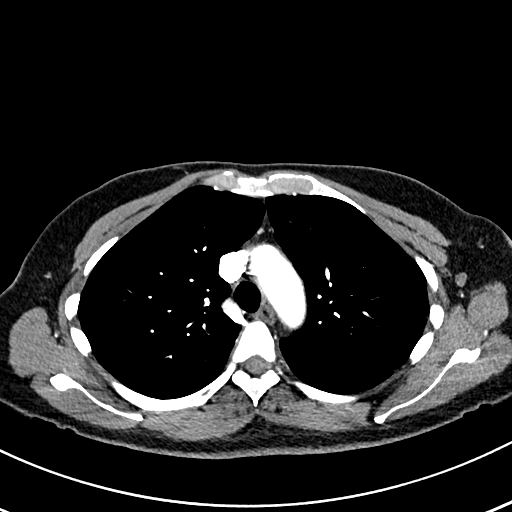
[im 118/168  lung]
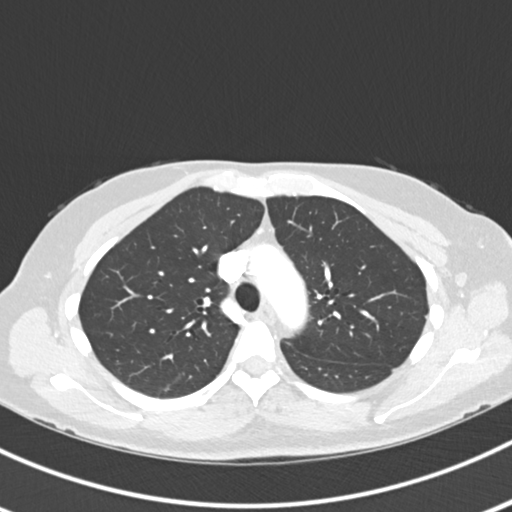
[im 130/168  lung]
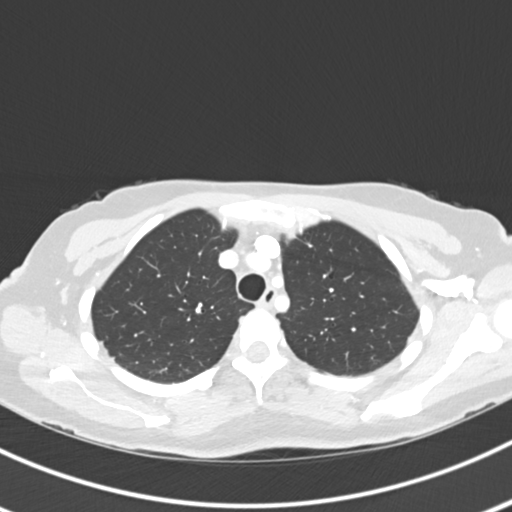
[im 143/168  lung]
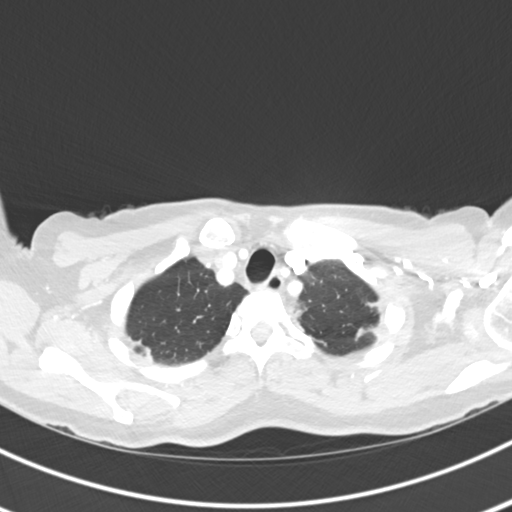
[im 155/168  lung]
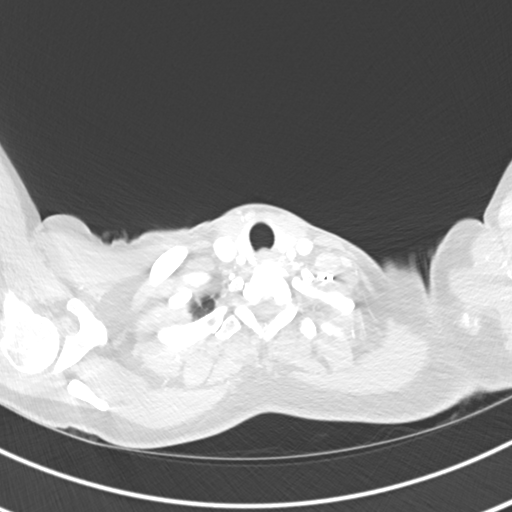

[Series 5: coronal · coronal · 0.57mm/px · 3 of 100 slices shown]
[im 20/100  lung]
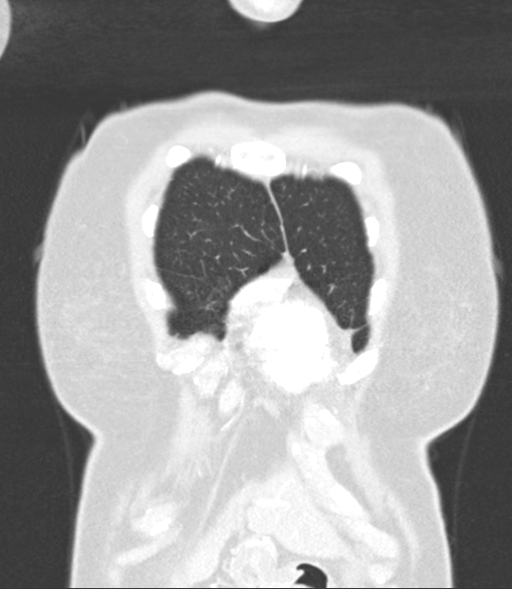
[im 40/100  lung]
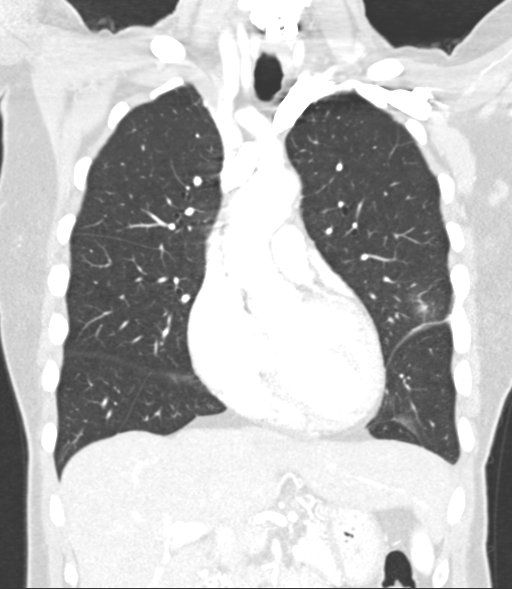
[im 60/100  lung]
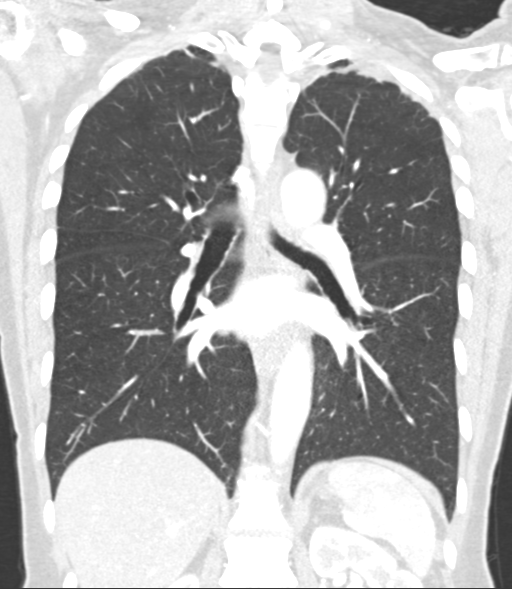

[15 of 36 positions shown; findings below may reference images not displayed]

FINDINGS: Mediastinum/Nodes: Visualized thyroid is unremarkable. Normal heart
size. No pericardial effusion. Aorta and main pulmonary artery
normal in caliber.

Lungs/Pleura: Central airways are patent. In the left upper lobe
there is a 20 x 20 mm nodule (20 mm mean diameter) on image 89 of
series 3. In the left lower lobe there is a 5 x 3 mm nodule (4 mm
mean diameter) on image 83 of series 3. Biapical pleural parenchymal
thickening and scarring. No pleural effusion or pneumothorax.
Unremarkable

Upper abdomen: Unremarkable

Musculoskeletal: No aggressive or acute appearing osseous lesions.
IMPRESSION: There is a 2.0 cm solitary nodule within the left upper lobe which
is nonspecific and may be secondary to pneumonia or pulmonary
malignancy. Recommend correlation for signs of infection. Consider
one of the following in 3 months for both low-risk and high-risk
individuals: (a) repeat chest CT, (b) follow-up PET-CT, or (c)
tissue sampling. This recommendation follows the consensus
statement: Guidelines for Management of Incidental Pulmonary Nodules
Detected on CT Images:From the [HOSPITAL] 6175; published
online before print (10.1148/radiol.8849454548).

Additional nonspecific 4 mm subpleural nodule left lower lobe.
Recommend attention on followup.

## 2018-04-05 ENCOUNTER — Other Ambulatory Visit: Payer: Self-pay | Admitting: Podiatry

## 2018-04-05 MED ORDER — ONDANSETRON HCL 4 MG PO TABS: 4.0000 mg | ORAL_TABLET | Freq: Three times a day (TID) | ORAL | 0 refills | Status: DC | PRN

## 2018-04-05 MED ORDER — CLINDAMYCIN HCL 150 MG PO CAPS: 150.0000 mg | ORAL_CAPSULE | Freq: Three times a day (TID) | ORAL | 0 refills | Status: DC

## 2018-04-05 MED ORDER — OXYCODONE-ACETAMINOPHEN 10-325 MG PO TABS: 1.0000 | ORAL_TABLET | Freq: Four times a day (QID) | ORAL | 0 refills | Status: AC | PRN

## 2018-04-07 ENCOUNTER — Telehealth: Payer: Self-pay | Admitting: *Deleted

## 2018-04-07 NOTE — Telephone Encounter (Signed)
I have rescheduled pt's post op appointments to reflect her new surgery date.

## 2018-04-07 NOTE — Telephone Encounter (Signed)
"  Did the patient call you and cancel her surgery for today?"  Not that I am aware.  "She said she has a UTI."  I am not aware of the cancellation.  Do you want me to go ahead and take her off completely or do you want to call and see if she wants to reschedule it?"  I'll give her a call.  "I was supposed to have had surgery today.  They went and they sent me home.  They said I couldn't do it to day.  Can I reschedule it?"  Dr. Milinda Pointer does not have anything available soon.  His next available is August 9.  "Go ahead and put me down for then.  If there's any cancellations sooner can you let me know?"  Yes, I sure will.  I called Caren Griffins at the surgical center and rescheduled the surgery to 06/02/2018.

## 2018-04-13 ENCOUNTER — Other Ambulatory Visit: Payer: BC Managed Care – PPO

## 2018-05-31 ENCOUNTER — Other Ambulatory Visit: Payer: Self-pay | Admitting: Podiatry

## 2018-05-31 MED ORDER — DOXYCYCLINE HYCLATE 100 MG PO TABS: 100.0000 mg | ORAL_TABLET | Freq: Two times a day (BID) | ORAL | 0 refills | Status: DC

## 2018-05-31 MED ORDER — OXYCODONE-ACETAMINOPHEN 10-325 MG PO TABS: 1.0000 | ORAL_TABLET | Freq: Three times a day (TID) | ORAL | 0 refills | Status: AC | PRN

## 2018-05-31 MED ORDER — ONDANSETRON HCL 4 MG PO TABS: 4.0000 mg | ORAL_TABLET | Freq: Three times a day (TID) | ORAL | 0 refills | Status: DC | PRN

## 2018-06-01 ENCOUNTER — Telehealth: Payer: Self-pay | Admitting: Podiatry

## 2018-06-01 NOTE — Telephone Encounter (Signed)
I called and spoke to Patricia Simpson and told her we had left another scrub brush up front at the check in/check out desk here in the [place] office for her to pick up this evening. Pt asked what time we closed and I told her 5 pm. Pt then asked if she would have to completely undress at the surgical center and put a gown on tomorrow for her surgery. I told her I was not sure as I'm helping fill in. I said they might have her do that but not to hold me to that. Pt thanked me for calling her back.

## 2018-06-01 NOTE — Telephone Encounter (Signed)
Called pt and left a voicemail letting her know that we have another scrub brush up front for her to pick up in the office today to clean her foot with tonight before surgery tomorrow. Told her if she had any questions to call us back at [phone].

## 2018-06-01 NOTE — Telephone Encounter (Signed)
Per Caren Griffins at [facility] pt called stating that she used the scrub brush given in the bag with the ice pack when her surgery was scheduled back in June. Pt wants to know what to do about cleaning her foot before her surgery tomorrow. Requested a call back at [phone].

## 2018-06-02 DIAGNOSIS — M2041 Other hammer toe(s) (acquired), right foot: Secondary | ICD-10-CM | POA: Diagnosis not present

## 2018-06-02 DIAGNOSIS — M2011 Hallux valgus (acquired), right foot: Secondary | ICD-10-CM | POA: Diagnosis not present

## 2018-06-08 ENCOUNTER — Ambulatory Visit (INDEPENDENT_AMBULATORY_CARE_PROVIDER_SITE_OTHER): Payer: BC Managed Care – PPO | Admitting: Podiatry

## 2018-06-08 ENCOUNTER — Ambulatory Visit (INDEPENDENT_AMBULATORY_CARE_PROVIDER_SITE_OTHER): Payer: BC Managed Care – PPO

## 2018-06-08 VITALS — BP 112/67 | HR 66 | Temp 98.0°F

## 2018-06-08 DIAGNOSIS — M779 Enthesopathy, unspecified: Secondary | ICD-10-CM

## 2018-06-08 DIAGNOSIS — M2011 Hallux valgus (acquired), right foot: Secondary | ICD-10-CM

## 2018-06-08 DIAGNOSIS — M778 Other enthesopathies, not elsewhere classified: Secondary | ICD-10-CM

## 2018-06-08 NOTE — Progress Notes (Signed)
Presents today date of surgery June 02, 2018 status post [facility] bunionectomy tenotomies hammertoe repair 2345 right.  Overall she states she is been doing pretty well.  She denies fever chills nausea vomiting muscle aches pains calf pain back pain chest pain shortness of breath.  Objective: Presents today vital signs stable alert and oriented x3 presents wearing her cam walker right lower extremity.  Was removed demonstrates dressed a compressive dressing was removed demonstrates mild edema no erythema no cellulitis drainage or odor.  She has good passive range of motion of the first metatarsophalangeal joint.  Toes are sitting nice rectus position.  Radiographs taken today demonstrate capital osteotomy in good position.  Mild varus deformity but the this is more than likely associated with the dressing.  But otherwise arthroplasties to the all of the lesser digits.  Assessment: Well-healing surgical foot.  Plan: Redressed today dressed a compressive dressing placed her back in her Cam walker we will follow-up with her in a couple of weeks.

## 2018-06-12 ENCOUNTER — Telehealth: Payer: Self-pay | Admitting: Podiatry

## 2018-06-12 MED ORDER — HYDROMORPHONE HCL 4 MG PO TABS: ORAL_TABLET | ORAL | 0 refills | Status: DC

## 2018-06-12 NOTE — Telephone Encounter (Signed)
I spoke with pt and she stated the removal of boot and ace didn't help a lot, and they let the air out of the boot and it did help at the top. I told pt Dr. Milinda Pointer had ordered a different medication and the prescription could be picked up in the [facility].

## 2018-06-12 NOTE — Addendum Note (Signed)
Addended by: Harriett Sine D on: 06/12/2018 06:08 PM   Modules accepted: Orders

## 2018-06-12 NOTE — Telephone Encounter (Signed)
Pt states her foot is throbbing and the side of the leg hurts, she is not taking the  3 ibuprofen and tylenol, because it doesn't help. I asked pt if the calf was hot, hard, swollen and she denied. I told pt the compression dressing that was placed at the last appt may be too tight and to remove the boot, sock and ace wrap, elevate the foot for 15 minutes, but if the pain worsens to dangle the foot for 15 minutes, then place foot level with the hip and rewrap the ace beginning at the toes, rolling looser up the foot, replace sock and boot. I told pt I would send a message to Dr. Milinda Pointer and call again.

## 2018-06-12 NOTE — Telephone Encounter (Signed)
Try dilaudid 4mg .

## 2018-06-12 NOTE — Telephone Encounter (Signed)
I'm still in a lot of pain. I've been doing the ibuprofen/tylenol but my foot has been throbbing. I need something for the relief. I don't know what, but can somebody please call me at [phone]. Also, can something be called in for me because this is like really unbearable. Thank you.

## 2018-06-15 ENCOUNTER — Ambulatory Visit (INDEPENDENT_AMBULATORY_CARE_PROVIDER_SITE_OTHER): Payer: BC Managed Care – PPO | Admitting: Podiatry

## 2018-06-15 DIAGNOSIS — M779 Enthesopathy, unspecified: Secondary | ICD-10-CM

## 2018-06-15 DIAGNOSIS — M2011 Hallux valgus (acquired), right foot: Secondary | ICD-10-CM

## 2018-06-15 DIAGNOSIS — M778 Other enthesopathies, not elsewhere classified: Secondary | ICD-10-CM

## 2018-06-15 MED ORDER — OXYCODONE-ACETAMINOPHEN 5-325 MG PO TABS: 1.0000 | ORAL_TABLET | Freq: Four times a day (QID) | ORAL | 0 refills | Status: DC | PRN

## 2018-06-15 NOTE — Progress Notes (Signed)
She presents today date of surgery 06/02/2018 [facility] bunionectomy right foot with flexor tenotomies 2 3 and 4 hammertoe repair #234 and 5 of the right foot.  She states that it throbs a lot on it feels achy I also had pain on the side of my leg that she points to the medial aspect of the right leg.  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  She asked when she can start to exercise.  Objective: Vital signs are stable she is alert and oriented x3.  Pulses are palpable.  There is sutures are intact margins are well coapted she has good range of motion of the first metatarsophalangeal joint there is not a whole lot of edema present.  Radiographs not taken today.  Assessment: Well-healing surgical foot right.  Plan: I highly recommended to her that she stay off of her foot and keep it elevated for as long as she can at least for the next 2 weeks she does not want to do this but understands the necessity of it we will work with her in any way possible and we write a prescription for wheelchair allow her to keep her legs elevated while she is working.  I will follow-up with her in 1 week for suture removal in 2 weeks for reevaluation.

## 2018-06-22 ENCOUNTER — Ambulatory Visit (INDEPENDENT_AMBULATORY_CARE_PROVIDER_SITE_OTHER): Payer: BC Managed Care – PPO

## 2018-06-22 DIAGNOSIS — M778 Other enthesopathies, not elsewhere classified: Secondary | ICD-10-CM

## 2018-06-22 DIAGNOSIS — M2011 Hallux valgus (acquired), right foot: Secondary | ICD-10-CM

## 2018-06-22 DIAGNOSIS — M779 Enthesopathy, unspecified: Secondary | ICD-10-CM

## 2018-06-27 ENCOUNTER — Telehealth: Payer: Self-pay | Admitting: *Deleted

## 2018-06-27 DIAGNOSIS — M79604 Pain in right leg: Secondary | ICD-10-CM

## 2018-06-27 DIAGNOSIS — R609 Edema, unspecified: Secondary | ICD-10-CM

## 2018-06-27 NOTE — Telephone Encounter (Addendum)
Pt states she has had pain in her leg for a while now, but worsened over the weekend, and she is worried about a blood clot.  Pt states last night she felt it wake her with a throbbing pain running up her leg and she felt a knot, has swelling at the ankle, and the right leg is a little warmer than the left, but no streaking or firmness of the leg. I told pt I would like to have her tested for a blood clot and would contact a group we use frequently for testing and call her with the appt.

## 2018-06-27 NOTE — Telephone Encounter (Signed)
[facility] scheduled pt for right venous doppler 06/28/2018 at 11:00am arrive 10:45am. Orders called to pt.

## 2018-06-28 ENCOUNTER — Ambulatory Visit (HOSPITAL_COMMUNITY)
Admission: RE | Admit: 2018-06-28 | Discharge: 2018-06-28 | Disposition: A | Discharge status: Home / Self Care | Payer: BC Managed Care – PPO | Source: Ambulatory Visit | Attending: Cardiovascular Disease | Admitting: Cardiovascular Disease

## 2018-06-28 DIAGNOSIS — R609 Edema, unspecified: Secondary | ICD-10-CM | POA: Insufficient documentation

## 2018-06-28 DIAGNOSIS — M79604 Pain in right leg: Secondary | ICD-10-CM | POA: Diagnosis present

## 2018-06-28 NOTE — Addendum Note (Signed)
Addended by: Harriett Sine D on: 06/28/2018 09:04 AM   Modules accepted: Orders

## 2018-06-28 NOTE — Progress Notes (Signed)
Patient is here today for follow-up appointment from surgery performed on 06/02/2018, surgery was [facility] bunionectomy right foot with flexor tenotomy's including second third and fourth toe, hammertoe repair second third fourth and fifth toe right foot.  She states that her foot has been feeling a lot better, she recently went to a wedding but did not overdo it and remained in her boot.  Noted well-healing surgical sites, very minimal swelling, no erythema, no redness, no drainage, no other signs and symptoms of infection.  Sutures removed and wound edges were coapted, with no gapping noted.  Discussed signs and symptoms of infection with patient, also showed her how to bandage each 1 of her toes.  Advised her she could get her foot wet within 24 hours.  She is to remain in her boot or surgical shoe at all times.  She is to follow-up in 2 weeks or sooner if any acute symptoms arise.

## 2018-06-29 ENCOUNTER — Ambulatory Visit (INDEPENDENT_AMBULATORY_CARE_PROVIDER_SITE_OTHER): Payer: BC Managed Care – PPO | Admitting: Podiatry

## 2018-06-29 ENCOUNTER — Ambulatory Visit (INDEPENDENT_AMBULATORY_CARE_PROVIDER_SITE_OTHER): Payer: BC Managed Care – PPO

## 2018-06-29 DIAGNOSIS — M779 Enthesopathy, unspecified: Secondary | ICD-10-CM | POA: Diagnosis not present

## 2018-06-29 DIAGNOSIS — M79604 Pain in right leg: Secondary | ICD-10-CM

## 2018-06-29 DIAGNOSIS — M2011 Hallux valgus (acquired), right foot: Secondary | ICD-10-CM

## 2018-06-29 DIAGNOSIS — M778 Other enthesopathies, not elsewhere classified: Secondary | ICD-10-CM

## 2018-06-30 ENCOUNTER — Telehealth: Payer: Self-pay | Admitting: *Deleted

## 2018-06-30 NOTE — Telephone Encounter (Signed)
I informed pt Dr. Milinda Pointer had received the results of the venous doppler and wanted to see how she was feeling. Pt states she was feeling better, and had gotten the Salonpas and applied to the painful areas of the feet and it worked. Pt states Dr. Milinda Pointer said if they worked for her, he could write a prescription for them. Pt states if he wanted he could write the prescription.

## 2018-06-30 NOTE — Telephone Encounter (Signed)
-----   Message from Garrel Ridgel, [place] sent at 06/29/2018  4:56 PM EDT ----- Negative DVT

## 2018-07-01 NOTE — Progress Notes (Signed)
She presents today date of surgery 06/02/2018 Patricia Simpson bunionectomy right tenotomy single flexor second and third right hammertoe repair fourth fifth right states that she still has some numbness in her toes she uses her Ace bandage for compression all the time.  Objective: Vital signs are stable she is alert and oriented x3.  Pulses are palpable.  Foot is rectus she has good range of motion of all of her toes appears to be healing well no erythema edema cellulitis drainage or odor.  Radiographs taken demonstrate foot appears to be healing without complications.  Assessment: I would like to follow-up with her in 2 weeks prior to her going back to work and hopefully she will be in a regular shoe at that point.

## 2018-07-01 NOTE — Telephone Encounter (Signed)
Write a rx for 5%lidocaine patches please.

## 2018-07-03 MED ORDER — LIDOCAINE 5 % EX PTCH: 1.0000 | MEDICATED_PATCH | CUTANEOUS | 5 refills | Status: DC

## 2018-07-03 NOTE — Telephone Encounter (Signed)
I informed pt Dr. Milinda Pointer had written the prescription strength for the patches, she was using and they would be at the pharmacy.

## 2018-07-13 ENCOUNTER — Ambulatory Visit (INDEPENDENT_AMBULATORY_CARE_PROVIDER_SITE_OTHER): Payer: BC Managed Care – PPO

## 2018-07-13 ENCOUNTER — Encounter: Payer: Self-pay | Admitting: Podiatry

## 2018-07-13 ENCOUNTER — Ambulatory Visit (INDEPENDENT_AMBULATORY_CARE_PROVIDER_SITE_OTHER): Payer: Self-pay | Admitting: Podiatry

## 2018-07-13 DIAGNOSIS — M2041 Other hammer toe(s) (acquired), right foot: Secondary | ICD-10-CM | POA: Diagnosis not present

## 2018-07-13 DIAGNOSIS — Z9889 Other specified postprocedural states: Secondary | ICD-10-CM

## 2018-07-13 DIAGNOSIS — M2011 Hallux valgus (acquired), right foot: Secondary | ICD-10-CM | POA: Diagnosis not present

## 2018-07-13 NOTE — Progress Notes (Signed)
She presents today for a postop visit date of surgery June 02, 2018 status post [facility] bunionectomy tenotomies toes 234 and 5.  She states that she is doing very well with the surgery itself however the neuropathy is about to kill her.  She is tried lidocaine patches which have helped considerably.  The lidocaine patch that we prescribed was not covered by her insurance.  Objective: Vital signs are stable she is alert and oriented x3 great range of motion of the first metatarsophalangeal joint of the right foot and toes are setting rectus.  Assessment: Well-healing surgical foot with neuropathy right.  Plan: Encouraged her to try to get into regular shoe gear and to use the lidocaine patches at nighttime.  I will follow-up with her in 3 weeks she will continue out of work for another 4 weeks.

## 2018-08-01 ENCOUNTER — Ambulatory Visit (INDEPENDENT_AMBULATORY_CARE_PROVIDER_SITE_OTHER): Payer: BC Managed Care – PPO

## 2018-08-01 ENCOUNTER — Ambulatory Visit (INDEPENDENT_AMBULATORY_CARE_PROVIDER_SITE_OTHER): Payer: BC Managed Care – PPO | Admitting: Podiatry

## 2018-08-01 ENCOUNTER — Telehealth: Payer: Self-pay | Admitting: Podiatry

## 2018-08-01 DIAGNOSIS — M2011 Hallux valgus (acquired), right foot: Secondary | ICD-10-CM | POA: Diagnosis not present

## 2018-08-01 DIAGNOSIS — M2041 Other hammer toe(s) (acquired), right foot: Secondary | ICD-10-CM

## 2018-08-01 NOTE — Telephone Encounter (Signed)
My foot is still swollen and discolored. The toes are still swollen and the underside of my foot at the back is swollen really bad. Now I have swelling in my leg up to under my knee.

## 2018-08-01 NOTE — Progress Notes (Signed)
Subjective: 57 year old female presents the office as an add-on appointment for concerns of swelling, pain to her right foot.  She previously had an [place] bunionectomy with tenotomy to digits 2, 3, 4, 5.  She states that she has had quite a bit of swelling to the foot she states that she had some to the front of the knee as well.  She has no pain to the calf or swelling to the calf.  She states that she gets a sharp pain that time and she is currently on Cymbalta that was prescribed by her primary care physician.  She has noticed some tightness on the ankle as well aches at nighttime.  She is concerned about her circulation as well as the nerves, tendons and muscles.  She denies any recent injury.  She is still wearing a surgical shoe. Denies any systemic complaints such as fevers, chills, nausea, vomiting. No acute changes since last appointment, and no other complaints at this time.   Objective: AAO x3, NAD DP/PT pulses palpable 2/4  bilaterally, CRT less than 3 seconds Incisions are all well-healed from the prior surgery.  There is tenderness mostly along submetatarsal 1-5 and there is no tenderness palpation of the dorsal aspect of the metatarsal heads.  There is no erythema or increase in warmth there is no fluctuation or crepitation. No open lesions or pre-ulcerative lesions.  There is minimal swelling to the anterior aspect of the very proximal leg just distal to the knee.  There is no erythema or warmth or any signs of infection of this area as well. No pain with calf compression, swelling, warmth, erythema  Assessment: Right foot pain, swelling status post surgery right foot  Plan: -All treatment options discussed with the patient including all alternatives, risks, complications.  - Overall her symptoms have not changed and she last saw Dr. Milinda Pointer she is concerned it is still going on.  Today I did apply an Unna boot for her and I want her to move in the surgical shoe continue elevation ice to  the area.  Discussed physical therapy but she has an appointment scheduled for Thursday already with Dr. Milinda Pointer so we will defer to him.  She is concerned about circulation.  She had a venous duplex study which was negative.  ABI was normal left was 1.13 right was 1.22.  Discussed possibly changing to Lyrica from Cymbalta although I think more of her pain is coming from the swelling. -Overall her symptoms have not worsened or changed.  Will defer to Dr. Milinda Pointer on Thursday. -Patient encouraged to call the office with any questions, concerns, change in symptoms.   Trula Slade DPM

## 2018-08-03 ENCOUNTER — Ambulatory Visit (INDEPENDENT_AMBULATORY_CARE_PROVIDER_SITE_OTHER): Payer: BC Managed Care – PPO | Admitting: Podiatry

## 2018-08-03 ENCOUNTER — Encounter: Payer: Self-pay | Admitting: Podiatry

## 2018-08-03 DIAGNOSIS — M2041 Other hammer toe(s) (acquired), right foot: Secondary | ICD-10-CM

## 2018-08-03 DIAGNOSIS — M2011 Hallux valgus (acquired), right foot: Secondary | ICD-10-CM

## 2018-08-03 DIAGNOSIS — Z9889 Other specified postprocedural states: Secondary | ICD-10-CM

## 2018-08-03 NOTE — Progress Notes (Signed)
She presents today for follow-up of the edema to her surgical foot right.  Date of surgery June 02, 2018 states that she is doing quite well.  She states that the toe is still stiff and she continues to wear her cam walker.  She presents today with her cam walker and Darco shoe with her Unna boot on from earlier this week.  Objective: Vital signs are stable alert and oriented x3.  Pulses are palpable.  The boot was removed much decrease in edema good range of motion of all of the surgical sites.  Plan: Allow her to get back to work over the next couple weeks and encouraging her to get back into tennis shoes and get her foot and leg moving once again.  I feel that doing this will help alleviate some of the soreness and the edema in the lower extremity.

## 2018-08-31 ENCOUNTER — Ambulatory Visit (INDEPENDENT_AMBULATORY_CARE_PROVIDER_SITE_OTHER): Payer: BC Managed Care – PPO

## 2018-08-31 ENCOUNTER — Ambulatory Visit (INDEPENDENT_AMBULATORY_CARE_PROVIDER_SITE_OTHER): Payer: BC Managed Care – PPO | Admitting: Podiatry

## 2018-08-31 ENCOUNTER — Encounter: Payer: Self-pay | Admitting: Podiatry

## 2018-08-31 DIAGNOSIS — M2011 Hallux valgus (acquired), right foot: Secondary | ICD-10-CM | POA: Diagnosis not present

## 2018-08-31 DIAGNOSIS — M2041 Other hammer toe(s) (acquired), right foot: Secondary | ICD-10-CM

## 2018-08-31 DIAGNOSIS — Z9889 Other specified postprocedural states: Secondary | ICD-10-CM

## 2018-08-31 NOTE — Progress Notes (Signed)
She presents today for her postop visit date of surgery is June 02, 2018 she is also had tenotomies and hammertoe repairs 2 through 5.  She states that she is doing great has no problems whatsoever is been walking with regular shoe gear would like to get back to work full-time.  She states that she has been working the big toe.  When I asked her to show me what she was doing she states that she had been working this joint as she points to the hallux interphalangeal joint and shows me how she was bending just the tip of the toe she had not been really working on the metatarsal phalangeal joint itself.  Objective: Vital signs are stable she is alert and oriented x3 pulses are palpable minimal edema no erythema cellulitis drainage or odor she has some limitation on plantarflexion which is started to give way to some hallux malleus but otherwise the remainder of the toes look nice and relaxed some plantar flexion at the PIPJ's but this is the way she wanted them to be rather than being fused they are mildly edematous no open lesions or wounds.  Radiographs taken today demonstrate a well-healed osteotomies with internal fixation in good position.  Mild malleus of the hallux.  Assessment: Limitation on range of motion plantarly of the first metatarsophalangeal joint is the biggest concern with early malleus development.  Plan: At this point I demonstrated to her how to work on the first metatarsophalangeal joint with plantarflexion by resting her weight of her leg on her foot with her foot behind her.  She understands and is amenable to it and she would like to try this before physical therapy.  I will follow-up with her on an as-needed basis only we may follow-up with her in the spring for possible scar revision.

## 2018-09-12 ENCOUNTER — Ambulatory Visit: Payer: BC Managed Care – PPO | Admitting: Diagnostic Neuroimaging

## 2019-01-28 NOTE — Progress Notes (Signed)
Erroneous encounter

## 2019-01-29 ENCOUNTER — Encounter: Payer: Self-pay | Admitting: Cardiology

## 2019-01-29 ENCOUNTER — Ambulatory Visit: Payer: BC Managed Care – PPO | Admitting: Cardiology

## 2019-01-29 ENCOUNTER — Encounter: Payer: BC Managed Care – PPO | Admitting: Cardiology

## 2019-01-29 ENCOUNTER — Other Ambulatory Visit: Payer: Self-pay

## 2019-01-29 VITALS — BP 166/81 | HR 86 | Ht 64.0 in | Wt 117.0 lb

## 2019-01-29 DIAGNOSIS — R0609 Other forms of dyspnea: Secondary | ICD-10-CM | POA: Diagnosis not present

## 2019-01-29 DIAGNOSIS — I7300 Raynaud's syndrome without gangrene: Secondary | ICD-10-CM

## 2019-01-29 DIAGNOSIS — I341 Nonrheumatic mitral (valve) prolapse: Secondary | ICD-10-CM

## 2019-01-29 DIAGNOSIS — I10 Essential (primary) hypertension: Secondary | ICD-10-CM

## 2019-01-29 MED ORDER — AMLODIPINE BESYLATE 5 MG PO TABS: 5.0000 mg | ORAL_TABLET | Freq: Every day | ORAL | 2 refills | Status: DC
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
## 2019-01-30 ENCOUNTER — Other Ambulatory Visit: Payer: Self-pay | Admitting: Cardiology

## 2019-02-23 ENCOUNTER — Telehealth: Payer: Self-pay

## 2019-02-23 NOTE — Telephone Encounter (Signed)
Pt called to let you know that she had labs done and wanted to know if received. I do not see anything yet. Asking about echo as well as far as when it can be scheduled if needed.//ah

## 2019-02-26 NOTE — Telephone Encounter (Signed)
I do not see the lab results. It looks as though she is scheduled for echo with [place] on 06/02 and would recommend that

## 2019-02-26 NOTE — Telephone Encounter (Signed)
Pt aware of no labs received yet. She will call her pcp office to make sure they fax results to her. Aware of echo appt.//ah

## 2019-03-13 ENCOUNTER — Ambulatory Visit (INDEPENDENT_AMBULATORY_CARE_PROVIDER_SITE_OTHER): Payer: BC Managed Care – PPO

## 2019-03-13 ENCOUNTER — Ambulatory Visit: Payer: BC Managed Care – PPO | Admitting: Podiatry

## 2019-03-13 ENCOUNTER — Encounter: Payer: Self-pay | Admitting: Podiatry

## 2019-03-13 ENCOUNTER — Other Ambulatory Visit: Payer: Self-pay

## 2019-03-13 VITALS — Temp 99.0°F

## 2019-03-13 DIAGNOSIS — M778 Other enthesopathies, not elsewhere classified: Secondary | ICD-10-CM

## 2019-03-13 DIAGNOSIS — M779 Enthesopathy, unspecified: Secondary | ICD-10-CM | POA: Diagnosis not present

## 2019-03-13 DIAGNOSIS — M2041 Other hammer toe(s) (acquired), right foot: Secondary | ICD-10-CM | POA: Diagnosis not present

## 2019-03-13 NOTE — Patient Instructions (Signed)
Pre-Operative Instructions  Congratulations, you have decided to take an important step towards improving your quality of life.  You can be assured that the doctors and staff at [facility] will be with you every step of the way.  Here are some important things you should know:  1. Plan to be at the surgery center/hospital at least 1 (one) hour prior to your scheduled time, unless otherwise directed by the surgical center/hospital staff.  You must have a responsible adult accompany you, remain during the surgery and drive you home.  Make sure you have directions to the surgical center/hospital to ensure you arrive on time. 2. If you are having surgery at [facility] or [facility], you will need a copy of your medical history and physical form from your family physician within one month prior to the date of surgery. We will give you a form for your primary physician to complete.  3. We make every effort to accommodate the date you request for surgery.  However, there are times where surgery dates or times have to be moved.  We will contact you as soon as possible if a change in schedule is required.   4. No aspirin/ibuprofen for one week before surgery.  If you are on aspirin, any non-steroidal anti-inflammatory medications (Mobic, Aleve, Ibuprofen) should not be taken seven (7) days prior to your surgery.  You make take Tylenol for pain prior to surgery.  5. Medications - If you are taking daily heart and blood pressure medications, seizure, reflux, allergy, asthma, anxiety, pain or diabetes medications, make sure you notify the surgery center/hospital before the day of surgery so they can tell you which medications you should take or avoid the day of surgery. 6. No food or drink after midnight the night before surgery unless directed otherwise by surgical center/hospital staff. 7. No alcoholic beverages 24-hours prior to surgery.  No smoking 24-hours prior or 24-hours after  surgery. 8. Wear loose pants or shorts. They should be loose enough to fit over bandages, boots, and casts. 9. Don't wear slip-on shoes. Sneakers are preferred. 10. Bring your boot with you to the surgery center/hospital.  Also bring crutches or a walker if your physician has prescribed it for you.  If you do not have this equipment, it will be provided for you after surgery. 11. If you have not been contacted by the surgery center/hospital by the day before your surgery, call to confirm the date and time of your surgery. 12. Leave-time from work may vary depending on the type of surgery you have.  Appropriate arrangements should be made prior to surgery with your employer. 13. Prescriptions will be provided immediately following surgery by your doctor.  Fill these as soon as possible after surgery and take the medication as directed. Pain medications will not be refilled on weekends and must be approved by the doctor. 14. Remove nail polish on the operative foot and avoid getting pedicures prior to surgery. 15. Wash the night before surgery.  The night before surgery wash the foot and leg well with water and the antibacterial soap provided. Be sure to pay special attention to beneath the toenails and in between the toes.  Wash for at least three (3) minutes. Rinse thoroughly with water and dry well with a towel.  Perform this wash unless told not to do so by your physician.  Enclosed: 1 Ice pack (please put in freezer the night before surgery)   1 Hibiclens skin cleaner     Pre-op instructions  If you have any questions regarding the instructions, please do not hesitate to call our office.  [place]: [address], [place], [place] [address] -- [phone]  [place]: [address] [place], [place] [address] -- [phone]  [place]: [address]  [place], [place] [address] -- [phone]  [place]: [address], [address], [place], [place] [address] -- [phone]  Website:  [email or web address] 

## 2019-03-14 ENCOUNTER — Encounter: Payer: Self-pay | Admitting: Podiatry

## 2019-03-14 NOTE — Progress Notes (Signed)
She presents today history of previous surgery June 02, 2018 hammertoe repair and a bunion repair all in the right foot.  States that her feet are starting to ache now she feels a crunching in her toes and she states that really wish we can diffuse those toes and straighten them out she states that she is having a hard time with the great toenail because of starting with a curl down.  She denies any change in her past medical history medications allergies surgeries and social history.  Objective: Vital signs are stable she is alert and oriented x3.  Pulses are palpable.  Neurologic sensorium is intact.  Deep tendon reflexes are intact.  Muscle strength is normal symmetrical.  Orthopedic evaluation demonstrates all joints distal to the ankle full range of motion without crepitation without pain with exception of the PIPJ's of the toes 2 through 5.  These do demonstrate some crepitation.  They are rectus at the level of the metatarsal phalangeal joint of the contracted plantarly and rigidly at the PIPJ.  She also has a mallet toe deformity.  And under lapping fifth toe secondary to dorsiflexion of the fourth toe.  All this is confirmed by radiographs which do demonstrate arthroplasties.  Internal fixation of the first metatarsal is intact and in good position.  The toe does appear to be in malleus.  Most likely due to scar tissue.  Assessment: Pain in limb secondary to digital deformities from previous surgery and malleus deformity hallux right.  Plan: Discussed etiology pathology and surgical therapies at this point in time I highly recommended fusion of the hallux interphalangeal joint release of the first metatarsal phalangeal joint referral removal of screw.  I also recommended also trying to screw the toes #2 3 and 4 with a derotational arthroplasty of the fifth toe.  She understands this and is amenable to it she would like to try this I expressed to her that there is no 100% guarantee that this would  work she understands that and is amenable to it.

## 2019-03-16 ENCOUNTER — Telehealth: Payer: Self-pay | Admitting: *Deleted

## 2019-03-16 NOTE — Telephone Encounter (Addendum)
DOS 04/20/2019, CPT CODES: 84665 - HAMMER TOE REPAIR 2-5 , 28760 - HALLUX IPJ FUSION, 28285 - HAMMER TOE REPAIR 2-5 DIGITS, 20680 - REMOVAL FIXATION DEEP KWIRE/SCREW, [phone] - CAPSULOTOMY 1ST RIGHT FOOT  BCBS: Policy Effective : 01/77/9390 - 10/24/9998       In-Network    Max Per Benefit Period Year-to-Date Remaining  CoInsurance  20%   Deductible  $1250.00 $210.63  Out-Of-Pocket   $4890.00 $3577.91   AMBULATORY SURGERY  In Network  Copay Coinsurance  Not Applicable  30% per [place]

## 2019-03-26 DIAGNOSIS — M940 Chondrocostal junction syndrome [Tietze]: Secondary | ICD-10-CM

## 2019-03-26 HISTORY — DX: Chondrocostal junction syndrome (tietze): M94.0

## 2019-03-27 ENCOUNTER — Ambulatory Visit (INDEPENDENT_AMBULATORY_CARE_PROVIDER_SITE_OTHER): Payer: BC Managed Care – PPO

## 2019-03-27 ENCOUNTER — Telehealth: Payer: Self-pay | Admitting: *Deleted

## 2019-03-27 ENCOUNTER — Other Ambulatory Visit: Payer: Self-pay

## 2019-03-27 DIAGNOSIS — R0609 Other forms of dyspnea: Secondary | ICD-10-CM

## 2019-03-27 DIAGNOSIS — I341 Nonrheumatic mitral (valve) prolapse: Secondary | ICD-10-CM

## 2019-03-27 NOTE — Telephone Encounter (Signed)
"  I'm calling to give you all my new phone number.  Also, no one has called me regarding my arrival time yet."  What's your new phone number?  "It is [phone]."  Is this the number for your mobile phone?  "Yes, it is."  Someone from the surgical center will call you a day or two prior to your surgery date, that person will give you your arrival time.  "What is the healing time for this surgery?"  It takes about six to eight weeks for actual bone healing.  "So, I'm working from home.  I have not taken any time off from work.  Do you think I should request some time off?"  I would suggest you at least take a week off because you may need to take pain medication.  "I'll bring my forms to be completed when I come by today."

## 2019-03-28 ENCOUNTER — Telehealth: Payer: Self-pay | Admitting: *Deleted

## 2019-03-28 ENCOUNTER — Other Ambulatory Visit: Payer: Self-pay | Admitting: Podiatry

## 2019-03-28 MED ORDER — CLINDAMYCIN HCL 150 MG PO CAPS: 150.0000 mg | ORAL_CAPSULE | Freq: Three times a day (TID) | ORAL | 0 refills | Status: DC

## 2019-03-28 MED ORDER — OXYCODONE-ACETAMINOPHEN 10-325 MG PO TABS: 1.0000 | ORAL_TABLET | Freq: Four times a day (QID) | ORAL | 0 refills | Status: DC | PRN

## 2019-03-28 MED ORDER — ONDANSETRON HCL 4 MG PO TABS: 4.0000 mg | ORAL_TABLET | Freq: Three times a day (TID) | ORAL | 0 refills | Status: DC | PRN

## 2019-03-28 NOTE — Telephone Encounter (Signed)
"  I'm scheduled to have surgery on Friday.  Is the surgical center the only place that he does surgeries?"  Yes, he only does surgeries at that facility.  "The reason I'm asking is because I have a balance there from my other surgery.  I owe about $2000.  I don't have it right now.  So, this is the only place he does surgery?"  Yes, that is correct.  Would you like to cancel your surgery.  "No, not yet, I'm going to see what else I can work out."

## 2019-03-28 NOTE — Telephone Encounter (Signed)
You could offer her Dr. March Rummage.  He goes everywhere. It really does not matter to me as long as it benefits her.

## 2019-03-29 ENCOUNTER — Telehealth: Payer: Self-pay

## 2019-03-29 NOTE — Telephone Encounter (Signed)
Pt called wanting to know if her echo was good and if you can send it to [facility]

## 2019-03-29 NOTE — Telephone Encounter (Signed)
Lmom with details

## 2019-03-29 NOTE — Telephone Encounter (Signed)
"  I'm calling to cancel my surgery for this Friday."  I will cancel it.  Dr. Milinda Pointer said he can refer you to Dr. March Rummage, he's here in our practice.  He does surgeries at [facility].  "When can he do it?"  Dr. March Rummage can do it on April 18, 2019.  You will need to see him for a consultation appointment.  Would you be interested in seeing him?  "Yes, if Dr. Milinda Pointer feels it is okay."  [facility] requires a history and physical form to be completed within 30 days of the surgery date.  They also require you to have a COVID test.  "Okay, that's fine.  It shouldn't be a problem."  You probably need to see Dr. March Rummage as soon as possible so you will have time to arrange for your physical.  Would you like me to transfer you to a scheduler so you can schedule an appointment with him?  "Yes, that would be great."  I transferred her to Rema Fendt.  I canceled her surgery that's scheduled for 03/30/2019 at [facility] via their [facility].

## 2019-04-02 ENCOUNTER — Telehealth: Payer: Self-pay

## 2019-04-02 NOTE — Telephone Encounter (Signed)
Echo shows mild thickening of the mitral valve. MVP = mitral valve prolapse: that means that the left heart valve is slightly more redundant and hence when it closes, it leaks  blood in the opposite direction. Most times it is not improtant unless the leakage is moderate or severe. MD will continue to monitor in that case, however trace MR (mitral regurgitation) or very mild MR probably does not need any further evaluation. Most times there is no need for antibiotic prophylaxis.  But in view of thickening of the valve, I do sometimes recommend antibiotic prophylaxis prior to dental work up only.   Not sure about chest pain, can arrange a visit or VV

## 2019-04-02 NOTE — Telephone Encounter (Signed)
Pt called and wanted to know why she still has chest pain and said that, something was found last time, and she was given an antibiotic for an infection. ??

## 2019-04-02 NOTE — Telephone Encounter (Signed)
Pt wanted to know why her chest is still hurting and she said that this is the same as last time and she was given an antibiotic and it helped .

## 2019-04-02 NOTE — Telephone Encounter (Signed)
The patient said you found something and told her it was an infection

## 2019-04-02 NOTE — Telephone Encounter (Signed)
PT called looking for echo results

## 2019-04-02 NOTE — Telephone Encounter (Signed)
I have not seen patient before and not mentioned in Dr. Einar Gip previous note

## 2019-04-02 NOTE — Telephone Encounter (Signed)
Dr. Einar Gip felt was musculoskeletal chest pain in the past. She was on Mobic by her PCP in 2018. We did not give her a antibiotic.

## 2019-04-02 NOTE — Telephone Encounter (Signed)
OK VV SET UP

## 2019-04-04 ENCOUNTER — Other Ambulatory Visit: Payer: Self-pay

## 2019-04-04 ENCOUNTER — Encounter: Payer: Self-pay | Admitting: Cardiology

## 2019-04-04 ENCOUNTER — Ambulatory Visit: Payer: BC Managed Care – PPO | Admitting: Cardiology

## 2019-04-04 VITALS — BP 135/95 | Ht 64.0 in | Wt 127.0 lb

## 2019-04-04 DIAGNOSIS — I341 Nonrheumatic mitral (valve) prolapse: Secondary | ICD-10-CM | POA: Diagnosis not present

## 2019-04-04 DIAGNOSIS — I10 Essential (primary) hypertension: Secondary | ICD-10-CM | POA: Diagnosis not present

## 2019-04-04 DIAGNOSIS — I340 Nonrheumatic mitral (valve) insufficiency: Secondary | ICD-10-CM

## 2019-04-04 DIAGNOSIS — M940 Chondrocostal junction syndrome [Tietze]: Secondary | ICD-10-CM

## 2019-04-04 DIAGNOSIS — R0609 Other forms of dyspnea: Secondary | ICD-10-CM

## 2019-04-04 MED ORDER — AMLODIPINE BESYLATE 10 MG PO TABS: 10.0000 mg | ORAL_TABLET | Freq: Every day | ORAL | 3 refills | Status: DC
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
## 2019-04-05 ENCOUNTER — Other Ambulatory Visit: Payer: BC Managed Care – PPO

## 2019-04-06 ENCOUNTER — Ambulatory Visit (INDEPENDENT_AMBULATORY_CARE_PROVIDER_SITE_OTHER): Payer: BC Managed Care – PPO | Admitting: Podiatry

## 2019-04-06 ENCOUNTER — Other Ambulatory Visit: Payer: Self-pay

## 2019-04-06 DIAGNOSIS — M2041 Other hammer toe(s) (acquired), right foot: Secondary | ICD-10-CM

## 2019-04-06 DIAGNOSIS — M2011 Hallux valgus (acquired), right foot: Secondary | ICD-10-CM

## 2019-04-06 DIAGNOSIS — M778 Other enthesopathies, not elsewhere classified: Secondary | ICD-10-CM

## 2019-04-06 DIAGNOSIS — M216X9 Other acquired deformities of unspecified foot: Secondary | ICD-10-CM

## 2019-04-06 DIAGNOSIS — M779 Enthesopathy, unspecified: Secondary | ICD-10-CM | POA: Diagnosis not present

## 2019-04-10 ENCOUNTER — Telehealth: Payer: Self-pay | Admitting: *Deleted

## 2019-04-10 DIAGNOSIS — Z01812 Encounter for preprocedural laboratory examination: Secondary | ICD-10-CM

## 2019-04-10 NOTE — Telephone Encounter (Signed)
I left the patient messages to call me back.  I informed her we could not schedule her appointment for April 18, 2019.  We had to schedule it for April 20, 2019.  I need to ask if she had her history and physical form completed and to advise her about the Covid-19 test that she is required to have three days prior to her surgery date.     (Dr. March Rummage, please put in the pre-op and Covid-19 test orders.)

## 2019-04-11 ENCOUNTER — Telehealth: Payer: Self-pay

## 2019-04-11 NOTE — Telephone Encounter (Signed)
There is no specific letter that is written for patients for COVID unless patient's are very ill and we generally use the same guidelines for taking patients out of work if medically indicated. Example: If unable to work due to medical problem like heart failure, heart attack, stroke, etc.  But for prophylaxis for Corona Virus, we do not recommend to be out of work.  I do not know her situation with work place is as well.

## 2019-04-11 NOTE — Telephone Encounter (Signed)
"  I am returning your call with regards to my surgery that is scheduled for June 26 at [facility].  I did get your message about my surgery.  Call me back.  I need to know about the Covid testing and can you give me directions.  Am I supposed to get that done at the hospital or what happens as far as that's concerned?"  I was calling to let you know your surgery is scheduled for 04/20/2019 instead of 04/18/2019.  You will receive a call from the Pre-admit testing scheduler, that person will schedule your appointment for the Covid-19 test.  You will go to [facility] for the drive thru testing.  "They will let me know what time I need to be there for surgery correct?"  The pre-admit nurse will give you a call.  She(he) will give you your arrival time.  "That's all I needed to know, thanks for calling me back."

## 2019-04-11 NOTE — Telephone Encounter (Signed)
Pt stated that she needs a note to be out of work, stated that you talked her about it already.

## 2019-04-17 ENCOUNTER — Other Ambulatory Visit (HOSPITAL_COMMUNITY)
Admission: RE | Admit: 2019-04-17 | Discharge: 2019-04-17 | Disposition: A | Discharge status: Home / Self Care | Payer: BC Managed Care – PPO | Source: Ambulatory Visit | Attending: Podiatry | Admitting: Podiatry

## 2019-04-17 DIAGNOSIS — Z1159 Encounter for screening for other viral diseases: Secondary | ICD-10-CM | POA: Insufficient documentation

## 2019-04-17 LAB — SARS CORONAVIRUS 2 (TAT 6-24 HRS): SARS Coronavirus 2: NEGATIVE

## 2019-04-18 ENCOUNTER — Encounter (HOSPITAL_BASED_OUTPATIENT_CLINIC_OR_DEPARTMENT_OTHER): Payer: Self-pay | Admitting: *Deleted

## 2019-04-19 ENCOUNTER — Other Ambulatory Visit: Payer: Self-pay

## 2019-04-19 ENCOUNTER — Encounter (HOSPITAL_BASED_OUTPATIENT_CLINIC_OR_DEPARTMENT_OTHER): Payer: Self-pay | Admitting: *Deleted

## 2019-04-19 NOTE — Progress Notes (Signed)
Spoke w/ pt via phone for pre-op interview.  Npo after mn w/ exception clear liquids until 0545, then nothing by mouth, pt verbalized understanding.  Arrive at [facility] [employer].  Needs istat and ekg.  Will take cymbalta, oxybutynin, norvasc, estradiol am dos w/ sips of water.  Pt had covid test done 04-17-2019.  No drink because pre-orders not in epic at time of covid appt.  Pt pcp H&P dated 04-11-2019 w/ chart.  Also, pt cardiology note dated 04-04-2019 in chart and epic.

## 2019-04-19 NOTE — Progress Notes (Signed)

## 2019-04-20 ENCOUNTER — Ambulatory Visit (HOSPITAL_BASED_OUTPATIENT_CLINIC_OR_DEPARTMENT_OTHER)
Admission: RE | Admit: 2019-04-20 | Discharge: 2019-04-20 | Disposition: A | Discharge status: Home / Self Care | Payer: BC Managed Care – PPO | Attending: Podiatry | Admitting: Podiatry

## 2019-04-20 ENCOUNTER — Encounter (HOSPITAL_BASED_OUTPATIENT_CLINIC_OR_DEPARTMENT_OTHER): Payer: Self-pay | Admitting: Emergency Medicine

## 2019-04-20 ENCOUNTER — Ambulatory Visit (HOSPITAL_BASED_OUTPATIENT_CLINIC_OR_DEPARTMENT_OTHER): Payer: BC Managed Care – PPO | Admitting: Anesthesiology

## 2019-04-20 ENCOUNTER — Other Ambulatory Visit: Payer: Self-pay

## 2019-04-20 ENCOUNTER — Encounter (HOSPITAL_BASED_OUTPATIENT_CLINIC_OR_DEPARTMENT_OTHER)
Admission: RE | Disposition: A | Discharge status: Home / Self Care | Payer: Self-pay | Source: Home / Self Care | Attending: Podiatry

## 2019-04-20 DIAGNOSIS — M2031 Hallux varus (acquired), right foot: Secondary | ICD-10-CM | POA: Insufficient documentation

## 2019-04-20 DIAGNOSIS — M216X1 Other acquired deformities of right foot: Secondary | ICD-10-CM | POA: Diagnosis not present

## 2019-04-20 DIAGNOSIS — J449 Chronic obstructive pulmonary disease, unspecified: Secondary | ICD-10-CM | POA: Insufficient documentation

## 2019-04-20 DIAGNOSIS — M24674 Ankylosis, right foot: Secondary | ICD-10-CM | POA: Insufficient documentation

## 2019-04-20 DIAGNOSIS — Z4889 Encounter for other specified surgical aftercare: Secondary | ICD-10-CM | POA: Diagnosis not present

## 2019-04-20 DIAGNOSIS — F419 Anxiety disorder, unspecified: Secondary | ICD-10-CM | POA: Insufficient documentation

## 2019-04-20 DIAGNOSIS — Z882 Allergy status to sulfonamides status: Secondary | ICD-10-CM | POA: Insufficient documentation

## 2019-04-20 DIAGNOSIS — Z885 Allergy status to narcotic agent status: Secondary | ICD-10-CM | POA: Insufficient documentation

## 2019-04-20 DIAGNOSIS — I10 Essential (primary) hypertension: Secondary | ICD-10-CM | POA: Insufficient documentation

## 2019-04-20 DIAGNOSIS — M76891 Other specified enthesopathies of right lower limb, excluding foot: Secondary | ICD-10-CM | POA: Insufficient documentation

## 2019-04-20 DIAGNOSIS — M2041 Other hammer toe(s) (acquired), right foot: Secondary | ICD-10-CM | POA: Diagnosis not present

## 2019-04-20 DIAGNOSIS — Z880 Allergy status to penicillin: Secondary | ICD-10-CM | POA: Diagnosis not present

## 2019-04-20 DIAGNOSIS — Z888 Allergy status to other drugs, medicaments and biological substances status: Secondary | ICD-10-CM | POA: Diagnosis not present

## 2019-04-20 DIAGNOSIS — M7751 Other enthesopathy of right foot: Secondary | ICD-10-CM | POA: Diagnosis not present

## 2019-04-20 HISTORY — DX: Cardiac murmur, unspecified: R01.1

## 2019-04-20 HISTORY — PX: HAMMER TOE SURGERY: SHX385

## 2019-04-20 HISTORY — DX: Other cervical disc degeneration, unspecified cervical region: M50.30

## 2019-04-20 HISTORY — DX: Essential tremor: G25.0

## 2019-04-20 HISTORY — PX: CAPSULOTOMY: SHX379

## 2019-04-20 HISTORY — DX: Personal history of other infectious and parasitic diseases: Z86.19

## 2019-04-20 HISTORY — DX: Nonrheumatic mitral (valve) prolapse: I34.1

## 2019-04-20 HISTORY — DX: Other symptoms and signs involving the musculoskeletal system: R29.898

## 2019-04-20 HISTORY — PX: GASTROC RECESSION EXTREMITY: SHX6262

## 2019-04-20 HISTORY — PX: PROXIMAL INTERPHALANGEAL FUSION (PIP): SHX6043

## 2019-04-20 HISTORY — DX: Presence of spectacles and contact lenses: Z97.3

## 2019-04-20 HISTORY — PX: HARDWARE REMOVAL: SHX979

## 2019-04-20 HISTORY — DX: Unspecified acquired deformity of right lower leg: M21.961

## 2019-04-20 HISTORY — DX: Raynaud's syndrome without gangrene: I73.00

## 2019-04-20 LAB — POCT I-STAT 4, (NA,K, GLUC, HGB,HCT)
Glucose, Bld: 78 mg/dL (ref 70–99)
HCT: 42 % (ref 36.0–46.0)
Hemoglobin: 14.3 g/dL (ref 12.0–15.0)
Potassium: 3.6 mmol/L (ref 3.5–5.1)
Sodium: 139 mmol/L (ref 135–145)

## 2019-04-20 SURGERY — CORRECTION, HAMMER TOE
Anesthesia: General | Laterality: Right

## 2019-04-20 MED ORDER — ONDANSETRON HCL 4 MG/2ML IJ SOLN
INTRAMUSCULAR | Status: DC | PRN
  Administered 2019-04-20: 4 mg via INTRAVENOUS

## 2019-04-20 MED ORDER — MIDAZOLAM HCL 5 MG/5ML IJ SOLN
INTRAMUSCULAR | Status: DC | PRN
  Administered 2019-04-20 (×2): 1 mg via INTRAVENOUS

## 2019-04-20 MED ORDER — LIDOCAINE 2% (20 MG/ML) 5 ML SYRINGE
INTRAMUSCULAR | Status: AC
  Filled 2019-04-20: qty 5

## 2019-04-20 MED ORDER — PROPOFOL 10 MG/ML IV BOLUS
INTRAVENOUS | Status: AC
  Filled 2019-04-20: qty 20

## 2019-04-20 MED ORDER — FENTANYL CITRATE (PF) 100 MCG/2ML IJ SOLN
INTRAMUSCULAR | Status: AC
  Filled 2019-04-20: qty 2

## 2019-04-20 MED ORDER — LACTATED RINGERS IV SOLN
INTRAVENOUS | Status: DC
  Administered 2019-04-20 (×2): via INTRAVENOUS
  Filled 2019-04-20: qty 1000

## 2019-04-20 MED ORDER — HYDROMORPHONE HCL 2 MG PO TABS: 2.0000 mg | ORAL_TABLET | ORAL | 0 refills | Status: DC | PRN

## 2019-04-20 MED ORDER — PROPOFOL 10 MG/ML IV BOLUS
INTRAVENOUS | Status: DC | PRN
  Administered 2019-04-20: 20 mg via INTRAVENOUS
  Administered 2019-04-20: 150 mg via INTRAVENOUS

## 2019-04-20 MED ORDER — ROPIVACAINE HCL 5 MG/ML IJ SOLN
INTRAMUSCULAR | Status: DC | PRN
  Administered 2019-04-20 (×2): 20 mL via PERINEURAL

## 2019-04-20 MED ORDER — FENTANYL CITRATE (PF) 100 MCG/2ML IJ SOLN
INTRAMUSCULAR | Status: DC | PRN
  Administered 2019-04-20: 25 ug via INTRAVENOUS
  Administered 2019-04-20: 50 ug via INTRAVENOUS
  Administered 2019-04-20: 25 ug via INTRAVENOUS

## 2019-04-20 MED ORDER — ONDANSETRON HCL 4 MG/2ML IJ SOLN
INTRAMUSCULAR | Status: AC
  Filled 2019-04-20: qty 2

## 2019-04-20 MED ORDER — MEPERIDINE HCL 25 MG/ML IJ SOLN
6.2500 mg | INTRAMUSCULAR | Status: DC | PRN
  Filled 2019-04-20: qty 1

## 2019-04-20 MED ORDER — CLINDAMYCIN PHOSPHATE 900 MG/50ML IV SOLN
900.0000 mg | INTRAVENOUS | Status: AC
  Administered 2019-04-20: 900 mg via INTRAVENOUS
  Filled 2019-04-20: qty 50

## 2019-04-20 MED ORDER — METOCLOPRAMIDE HCL 5 MG/ML IJ SOLN
10.0000 mg | Freq: Once | INTRAMUSCULAR | Status: DC | PRN
  Filled 2019-04-20: qty 2

## 2019-04-20 MED ORDER — MIDAZOLAM HCL 2 MG/2ML IJ SOLN
INTRAMUSCULAR | Status: AC
  Filled 2019-04-20: qty 2

## 2019-04-20 MED ORDER — FENTANYL CITRATE (PF) 100 MCG/2ML IJ SOLN
100.0000 ug | Freq: Once | INTRAMUSCULAR | Status: AC
  Administered 2019-04-20: 100 ug via INTRAVENOUS
  Filled 2019-04-20: qty 2

## 2019-04-20 MED ORDER — FENTANYL CITRATE (PF) 100 MCG/2ML IJ SOLN
25.0000 ug | INTRAMUSCULAR | Status: DC | PRN
  Filled 2019-04-20: qty 1

## 2019-04-20 MED ORDER — LIDOCAINE HCL (CARDIAC) PF 100 MG/5ML IV SOSY
PREFILLED_SYRINGE | INTRAVENOUS | Status: DC | PRN
  Administered 2019-04-20: 50 mg via INTRAVENOUS

## 2019-04-20 MED ORDER — DEXAMETHASONE SODIUM PHOSPHATE 4 MG/ML IJ SOLN
INTRAMUSCULAR | Status: DC | PRN
  Administered 2019-04-20: 5 mg via INTRAVENOUS

## 2019-04-20 MED ORDER — MIDAZOLAM HCL 2 MG/2ML IJ SOLN
2.0000 mg | Freq: Once | INTRAMUSCULAR | Status: AC
  Administered 2019-04-20: 2 mg via INTRAVENOUS
  Filled 2019-04-20: qty 2

## 2019-04-20 MED ORDER — EPHEDRINE 5 MG/ML INJ
INTRAVENOUS | Status: AC
  Filled 2019-04-20: qty 10

## 2019-04-20 MED ORDER — LACTATED RINGERS IV SOLN
INTRAVENOUS | Status: DC
  Filled 2019-04-20: qty 1000

## 2019-04-20 MED ORDER — EPHEDRINE SULFATE 50 MG/ML IJ SOLN
INTRAMUSCULAR | Status: DC | PRN
  Administered 2019-04-20: 10 mg via INTRAVENOUS

## 2019-04-20 MED ORDER — GLYCOPYRROLATE PF 0.2 MG/ML IJ SOSY
PREFILLED_SYRINGE | INTRAMUSCULAR | Status: AC
  Filled 2019-04-20: qty 1

## 2019-04-20 MED ORDER — CLINDAMYCIN PHOSPHATE 900 MG/50ML IV SOLN
INTRAVENOUS | Status: AC
  Filled 2019-04-20: qty 50

## 2019-04-20 MED ORDER — CLINDAMYCIN HCL 150 MG PO CAPS: 150.0000 mg | ORAL_CAPSULE | Freq: Two times a day (BID) | ORAL | 0 refills | Status: DC

## 2019-04-20 SURGICAL SUPPLY — 107 items
APL PRP STRL LF DISP 70% ISPRP (MISCELLANEOUS) ×1
APL SKNCLS STERI-STRIP NONHPOA (GAUZE/BANDAGES/DRESSINGS)
Arthro-Lok Rounded Tip 4 mm Blade ×1 IMPLANT
BANDAGE ACE 3X5.8 VEL STRL LF (GAUZE/BANDAGES/DRESSINGS) ×2 IMPLANT
BANDAGE ACE 4X5 VEL STRL LF (GAUZE/BANDAGES/DRESSINGS) IMPLANT
BANDAGE ACE 6X5 VEL STRL LF (GAUZE/BANDAGES/DRESSINGS) ×1 IMPLANT
BANDAGE ELASTIC 4 VELCRO ST LF (GAUZE/BANDAGES/DRESSINGS) ×1 IMPLANT
BANDAGE ESMARK 6X9 LF (GAUZE/BANDAGES/DRESSINGS) ×1 IMPLANT
BENZOIN TINCTURE PRP APPL 2/3 (GAUZE/BANDAGES/DRESSINGS) IMPLANT
BIT DRILL 2.0MM (BIT) IMPLANT
BIT DRILL MICR ACTRK 2 LNG PRF (BIT) IMPLANT
BLADE AVERAGE 25X9 (BLADE) IMPLANT
BLADE HEX COATED 2.75 (ELECTRODE) ×2 IMPLANT
BLADE MINI RND TIP GREEN BEAV (BLADE) ×2 IMPLANT
BLADE OSC/SAG .038X5.5 CUT EDG (BLADE) IMPLANT
BLADE SURG 15 STRL LF DISP TIS (BLADE) ×1 IMPLANT
BLADE SURG 15 STRL SS (BLADE) ×10
BNDG CMPR 9X4 STRL LF SNTH (GAUZE/BANDAGES/DRESSINGS) ×1
BNDG CMPR 9X6 STRL LF SNTH (GAUZE/BANDAGES/DRESSINGS) ×1
BNDG COHESIVE 4X5 TAN STRL (GAUZE/BANDAGES/DRESSINGS) ×2 IMPLANT
BNDG ESMARK 4X9 LF (GAUZE/BANDAGES/DRESSINGS) ×2 IMPLANT
BNDG ESMARK 6X9 LF (GAUZE/BANDAGES/DRESSINGS) ×2
BNDG GAUZE ELAST 4 BULKY (GAUZE/BANDAGES/DRESSINGS) ×2 IMPLANT
CANISTER SUCT 1200ML W/VALVE (MISCELLANEOUS) IMPLANT
CHLORAPREP W/TINT 26 (MISCELLANEOUS) ×2 IMPLANT
COVER BACK TABLE REUSABLE LG (DRAPES) ×2 IMPLANT
COVER WAND RF STERILE (DRAPES) ×1 IMPLANT
CUFF TOURN SGL QUICK 18X4 (TOURNIQUET CUFF) IMPLANT
CUFF TOURN SGL QUICK 34 (TOURNIQUET CUFF) ×2
CUFF TRNQT CYL 34X4.125X (TOURNIQUET CUFF) IMPLANT
DECANTER SPIKE VIAL GLASS SM (MISCELLANEOUS) IMPLANT
DRAPE EXTREMITY T 121X128X90 (DISPOSABLE) ×2 IMPLANT
DRAPE IMP U-DRAPE 54X76 (DRAPES) ×2 IMPLANT
DRAPE OEC MINIVIEW 54X84 (DRAPES) ×2 IMPLANT
DRAPE U-SHAPE 47X51 STRL (DRAPES) ×2 IMPLANT
DRILL BIT 2.0MM (BIT) ×2
DRILL MICRO ACUTRAK 2 LNG PROF (BIT) ×2
DRSG EMULSION OIL 3X3 NADH (GAUZE/BANDAGES/DRESSINGS) ×2 IMPLANT
DRSG PAD ABDOMINAL 8X10 ST (GAUZE/BANDAGES/DRESSINGS) IMPLANT
DURAPREP 26ML APPLICATOR (WOUND CARE) ×1 IMPLANT
ELECT REM PT RETURN 9FT ADLT (ELECTROSURGICAL) ×2
ELECTRODE REM PT RTRN 9FT ADLT (ELECTROSURGICAL) ×1 IMPLANT
GAUZE 4X4 16PLY RFD (DISPOSABLE) IMPLANT
GAUZE SPONGE 4X4 12PLY STRL (GAUZE/BANDAGES/DRESSINGS) ×3 IMPLANT
GAUZE XEROFORM 1X8 LF (GAUZE/BANDAGES/DRESSINGS) ×3 IMPLANT
GLOVE BIO SURGEON STRL SZ7.5 (GLOVE) ×2 IMPLANT
GLOVE BIOGEL PI IND STRL 7.0 (GLOVE) ×1 IMPLANT
GLOVE BIOGEL PI IND STRL 8 (GLOVE) ×1 IMPLANT
GLOVE BIOGEL PI INDICATOR 7.0 (GLOVE) ×2
GLOVE BIOGEL PI INDICATOR 8 (GLOVE) ×1
GLOVE ECLIPSE 7.0 STRL STRAW (GLOVE) ×2 IMPLANT
GLOVE SURG ORTHO 8.0 STRL STRW (GLOVE) ×2 IMPLANT
GLOVE SURG SS PI 6.5 STRL IVOR (GLOVE) ×1 IMPLANT
GOWN STRL REUS W/ TWL LRG LVL3 (GOWN DISPOSABLE) ×1 IMPLANT
GOWN STRL REUS W/ TWL XL LVL3 (GOWN DISPOSABLE) ×2 IMPLANT
GOWN STRL REUS W/TWL LRG LVL3 (GOWN DISPOSABLE) ×6
GOWN STRL REUS W/TWL XL LVL3 (GOWN DISPOSABLE) ×4
GUIDEWIRE ORTHO MICROSHT  ACUT (WIRE) ×2
GUIDEWIRE ORTHO MICROSHT .035 (WIRE) IMPLANT
HYDROGEN PEROXIDE 16OZ (MISCELLANEOUS) ×1 IMPLANT
IMPL TOETAC XPRESS MED (Miscellaneous) IMPLANT
IMPL TOETAC XPRESS SM (Miscellaneous) IMPLANT
IMPLANT TOETAC XPRESS MED (Miscellaneous) ×2 IMPLANT
IMPLANT TOETAC XPRESS SM (Miscellaneous) ×2 IMPLANT
K-WIRE .045X4 (WIRE) ×1 IMPLANT
NDL HYPO 25X1 1.5 SAFETY (NEEDLE) ×1 IMPLANT
NEEDLE HYPO 25X1 1.5 SAFETY (NEEDLE) ×2 IMPLANT
NS IRRIG 1000ML POUR BTL (IV SOLUTION) ×2 IMPLANT
PACK BASIN DAY SURGERY FS (CUSTOM PROCEDURE TRAY) ×2 IMPLANT
PAD CAST 3X4 CTTN HI CHSV (CAST SUPPLIES) IMPLANT
PAD CAST 4YDX4 CTTN HI CHSV (CAST SUPPLIES) ×2 IMPLANT
PADDING CAST ABS 4INX4YD NS (CAST SUPPLIES) ×1
PADDING CAST ABS COTTON 4X4 ST (CAST SUPPLIES) ×1 IMPLANT
PADDING CAST COTTON 3X4 STRL (CAST SUPPLIES)
PADDING CAST COTTON 4X4 STRL (CAST SUPPLIES) ×4
PENCIL BUTTON HOLSTER BLD 10FT (ELECTRODE) ×2 IMPLANT
SCREW CANN ACUTRAK 2.5-2.8X28M (Screw) ×1 IMPLANT
SLEEVE SCD COMPRESS KNEE MED (MISCELLANEOUS) ×2 IMPLANT
SPONGE LAP 4X18 RFD (DISPOSABLE) ×2 IMPLANT
STAPLE FIXATION 8X8X8 (Staple) ×2 IMPLANT
STAPLER VISISTAT 35W (STAPLE) ×1 IMPLANT
STOCKINETTE 4X48 STRL (DRAPES) ×2 IMPLANT
STOCKINETTE 6  STRL (DRAPES) ×1
STOCKINETTE 6 STRL (DRAPES) ×1 IMPLANT
STOCKINETTE SYNTHETIC 4 NONSTR (MISCELLANEOUS) ×2 IMPLANT
SUCTION FRAZIER HANDLE 10FR (MISCELLANEOUS) ×1
SUCTION TUBE FRAZIER 10FR DISP (MISCELLANEOUS) IMPLANT
SUT ETHIBOND 2 OS 4 DA (SUTURE) IMPLANT
SUT ETHILON 3 0 PS 1 (SUTURE) IMPLANT
SUT ETHILON 4 0 PS 2 18 (SUTURE) ×4 IMPLANT
SUT MNCRL AB 3-0 PS2 18 (SUTURE) ×1 IMPLANT
SUT MNCRL AB 3-0 PS2 27 (SUTURE) ×2 IMPLANT
SUT MNCRL AB 4-0 PS2 18 (SUTURE) ×1 IMPLANT
SUT MON AB 5-0 PS2 18 (SUTURE) IMPLANT
SUT VIC AB 0 SH 27 (SUTURE) IMPLANT
SUT VIC AB 2-0 CT2 27 (SUTURE) ×1 IMPLANT
SUT VIC AB 3-0 FS2 27 (SUTURE) ×2 IMPLANT
SUT VIC AB 3-0 SH 27 (SUTURE)
SUT VIC AB 3-0 SH 27X BRD (SUTURE) IMPLANT
SUT VICRYL 4-0 PS2 18IN ABS (SUTURE) ×2 IMPLANT
SYR BULB 3OZ (MISCELLANEOUS) ×2 IMPLANT
SYR CONTROL 10ML LL (SYRINGE) ×2 IMPLANT
TOWEL GREEN STERILE FF (TOWEL DISPOSABLE) ×2 IMPLANT
TUBE CONNECTING 12X1/4 (SUCTIONS) ×1 IMPLANT
TUBE CONNECTING 20X1/4 (TUBING) IMPLANT
UNDERPAD 30X30 (UNDERPADS AND DIAPERS) ×2 IMPLANT
YANKAUER SUCT BULB TIP NO VENT (SUCTIONS) ×2 IMPLANT

## 2019-04-20 NOTE — Discharge Instructions (Signed)
Regional Anesthesia Blocks ° °1. Numbness or the inability to move the "blocked" extremity may last from 3-48 hours after placement. The length of time depends on the medication injected and your individual response to the medication. If the numbness is not going away after 48 hours, call your surgeon. ° °2. The extremity that is blocked will need to be protected until the numbness is gone and the  Strength has returned. Because you cannot feel it, you will need to take extra care to avoid injury. Because it may be weak, you may have difficulty moving it or using it. You may not know what position it is in without looking at it while the block is in effect. ° °3. For blocks in the legs and feet, returning to weight bearing and walking needs to be done carefully. You will need to wait until the numbness is entirely gone and the strength has returned. You should be able to move your leg and foot normally before you try and bear weight or walk. You will need someone to be with you when you first try to ensure you do not fall and possibly risk injury. ° °4. Bruising and tenderness at the needle site are common side effects and will resolve in a few days. ° °5. Persistent numbness or new problems with movement should be communicated to the surgeon . ° °Post Anesthesia Home Care Instructions ° °Activity: °Get plenty of rest for the remainder of the day. A responsible individual must stay with you for 24 hours following the procedure.  °For the next 24 hours, DO NOT: °-Drive a car °-Operate machinery °-Drink alcoholic beverages °-Take any medication unless instructed by your physician °-Make any legal decisions or sign important papers. ° °Meals: °Start with liquid foods such as gelatin or soup. Progress to regular foods as tolerated. Avoid greasy, spicy, heavy foods. If nausea and/or vomiting occur, drink only clear liquids until the nausea and/or vomiting subsides. Call your physician if vomiting continues. ° °Special  Instructions/Symptoms: °Your throat may feel dry or sore from the anesthesia or the breathing tube placed in your throat during surgery. If this causes discomfort, gargle with warm salt water. The discomfort should disappear within 24 hours. ° °If you had a scopolamine patch placed behind your ear for the management of post- operative nausea and/or vomiting: ° °1. The medication in the patch is effective for 72 hours, after which it should be removed.  Wrap patch in a tissue and discard in the trash. Wash hands thoroughly with soap and water. °2. You may remove the patch earlier than 72 hours if you experience unpleasant side effects which may include dry mouth, dizziness or visual disturbances. °3. Avoid touching the patch. Wash your hands with soap and water after contact with the patch. °  ° °

## 2019-04-20 NOTE — Progress Notes (Signed)
Assisted Dr. Marcell Barlow with right, ultrasound guided, popliteal, adductor canal block. Side rails up, monitors on throughout procedure. See vital signs in flow sheet. Tolerated Procedure well.

## 2019-04-20 NOTE — Brief Op Note (Signed)
04/20/2019  11:55 AM  PATIENT:  Patricia Simpson  58 y.o. 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  PROCEDURE:  Procedure(s) with comments: HAMMER TOE CORRECTION SECOND THRU FIFTH MANIPULATION OF ANKLE UNDER ANESTHESIA (Right) - GENERAL ANESTHESIA WITH BLOCK HALLUX INTERPHALANGEAL FUSION (Right) HARDWARE REMOVAL (Right) GASTROC RECESSION EXTREMITY (Right) CAPSULOTOMY FIRST RIGHT (Right)  SURGEON:  Surgeon(s) and Role:    * Evelina Bucy, DPM - Primary  PHYSICIAN ASSISTANT: None  ASSISTANTS: Despina Pole, RNFA   ANESTHESIA:   general  EBL:  25 mL   BLOOD ADMINISTERED:none  DRAINS: none   LOCAL MEDICATIONS USED:  NONE  SPECIMEN:  No Specimen  DISPOSITION OF SPECIMEN:  N/A  COUNTS:  YES  TOURNIQUET:   Total Tourniquet Time Documented: Thigh (Right) - 123 minutes Total: Thigh (Right) - 123 minutes   DICTATION: .Note written in EPIC  PLAN OF CARE: Discharge to home after PACU  PATIENT DISPOSITION:  PACU - hemodynamically stable.   Delay start of Pharmacological VTE agent (>24hrs) due to surgical blood loss or risk of bleeding: not applicable

## 2019-04-20 NOTE — Anesthesia Postprocedure Evaluation (Signed)
Anesthesia Post Note  Patient: [name]  Procedure(s) Performed: HAMMER TOE CORRECTION SECOND THRU FIFTH MANIPULATION OF ANKLE UNDER ANESTHESIA (Right ) HALLUX INTERPHALANGEAL FUSION (Right ) HARDWARE REMOVAL (Right ) GASTROC RECESSION EXTREMITY (Right ) CAPSULOTOMY FIRST RIGHT (Right )     Patient location during evaluation: PACU Anesthesia Type: General Level of consciousness: awake and alert Pain management: pain level controlled Vital Signs Assessment: post-procedure vital signs reviewed and stable Respiratory status: spontaneous breathing, nonlabored ventilation, respiratory function stable and patient connected to nasal cannula oxygen Cardiovascular status: blood pressure returned to baseline and stable Postop Assessment: no apparent nausea or vomiting Anesthetic complications: no    Last Vitals:  Vitals:   04/20/19 1230 04/20/19 1245  BP: (!) 134/58 116/72  Pulse: (!) 130 (!) 118  Resp: 17 13  Temp:    SpO2: 96% 95%    Last Pain:  Vitals:   04/20/19 1230  TempSrc:   PainSc: 0-No pain                 Montez Hageman

## 2019-04-20 NOTE — Progress Notes (Signed)
Subjective:  Patient ID: Patricia Simpson, female    DOB: 08/13/1961,  MRN: [id number]  Chief Complaint  Patient presents with  . Hammer Toe    surgical consult - [facility] referral    58 y.o. female presents with the above complaint.  Previous scheduled for surgery with Dr. Milinda Pointer however she needed to have surgery at the hospital and was referred for evaluation.  Still having pain in the right great toe joint with feeling of stiffness.  Thinks that the second third fourth and fifth toes are bending.  Feels that the muscles in her foot are tight.  Review of Systems: Negative except as noted in the HPI. Denies N/V/F/Ch.  Past Medical History:  Diagnosis Date  . Acquired deformity of right foot    s/p  hammertoe correction 06-02-2018  . Anxiety    "nervous episodes",   . Asthma    no inhaler  . Complication of anesthesia    2004- had hives post anesth. , ?relative to anesth. or Morphine  . Costochondritis 03/2019   pt had chest pain and DOE,  had cardiology consult dx costochondritis  . DDD (degenerative disc disease), cervical   . Dental bridge present    upper  . Essential tremor    bilateral hands ,  pt states residual from [place]  . GERD (gastroesophageal reflux disease)    occasional , no meds  . Headache(784.0)    migraines - unknown triggers  . Heart murmur   . History of hepatitis C followed by [facility] in [place] (notes in epic under media)   dx 2002 (per pt from blood transfusion's received 1986) ;  treated w/ medication and cured 2016  . Hypertension   . Left arm weakness    w/ numbness/ tingleing , per pt residual from [place]  . MVP (mitral valve prolapse)    cardiologist-- dr Einar Gip--- dx via echo 03-27-2019  mild MVP with moderate MR  . Raynaud's disease   . Seasonal allergies   . Wears glasses    No current facility-administered medications for this visit.  No current outpatient medications on file.  Facility-Administered Medications Ordered in  Other Visits:  .  clindamycin (CLEOCIN) IVPB 900 mg, 900 mg, Intravenous, On Call to OR, Price, Christian Mate, DPM .  lactated ringers infusion, , Intravenous, Continuous, Myrtie Soman, MD  Social History   Tobacco Use  Smoking Status Never Smoker  Smokeless Tobacco Never Used    Allergies  Allergen Reactions  . Amoxicillin Hives and Itching    Has patient had a PCN reaction causing immediate rash, facial/tongue/throat swelling, SOB or lightheadedness with hypotension: no Has patient had a PCN reaction causing severe rash involving mucus membranes or skin necrosis: no Has patient had a PCN reaction that required hospitalization: was already in the hospital Has patient had a PCN reaction occurring within the last 10 years: yes  . Vesicare [Solifenacin] Itching and Other (See Comments)    vertigo  . Wax [Beeswax] Hives and Itching  . Morphine And Related Hives  . Penicillins Hives    Has patient had a PCN reaction causing immediate rash, facial/tongue/throat swelling, SOB or lightheadedness with hypotension: no Has patient had a PCN reaction causing severe rash involving mucus membranes or skin necrosis: no Has patient had a PCN reaction that required hospitalization: was already in the hospital Has patient had a PCN reaction occurring within the last 10 years: yes If all of the above answers are "NO", then  may proceed with Cephalosporin use.   . Sulfa Antibiotics Hives   Objective:  There were no vitals filed for this visit. There is no height or weight on file to calculate BMI. Constitutional Well developed. Well nourished.  Vascular Dorsalis pedis pulses palpable bilaterally. Posterior tibial pulses palpable bilaterally. Capillary refill normal to all digits.  No cyanosis or clubbing noted. Pedal hair growth normal.  Neurologic Normal speech. Oriented to person, place, and time. Epicritic sensation to light touch grossly present bilaterally.  Dermatologic Nails well groomed  and normal in appearance. No open wounds. No skin lesions.  Orthopedic:  Right first MPJ slight stiffness with extensor to tendon contracture, hallux IPJ crepitus deformity.  Residual hammertoes 234 and 5, extensor tendon contractures, gastroc equinus with positive Silverskiold test.  Pain to palpation of the hammertoes, pain to the first metatarsophalangeal joint   Radiographs: Prior x-rays reviewed Assessment:   1. Capsulitis of foot, right   2. Hammer toe of right foot   3. Hav (hallux abducto valgus), right   4. Capsulitis of right foot   5. Equinus deformity of foot    Plan:  Patient was evaluated and treated and all questions answered.  Right first metatarsophalangeal joint fibrosis, retained hardware, hallux IPJ arthritis, tendon contracture, residual hammertoe deformities, gastroc equinus -Discussed expected goals outcome of the surgery.  I concur with Dr. Stephenie Acres surgical plan however I additionally discussed that due to the tightness in her calf she would additionally benefit from gastroc recession. -Patient has failed all conservative therapy and wishes to proceed with surgical intervention. All risks, benefits, and alternatives discussed with patient. No guarantees given. Consent reviewed and signed by patient. -Planned procedures: Right first metatarsal phalangeal joint release, first metatarsal removal of hardware, hallux interphalangeal joint fusion, possible extensor tendon lengthening, revision of hammertoes 234 with screw vs implant, revision of fifth hammertoe, gastroc lengthening  30 minutes of face to face time were spent with the patient. >50% of this was spent on counseling and coordination of care. Specifically discussed with patient the above diagnoses and overall treatment plan.   No follow-ups on file.

## 2019-04-20 NOTE — Anesthesia Procedure Notes (Signed)
Anesthesia Regional Block: Adductor canal block   Pre-Anesthetic Checklist: ,, timeout performed, Correct Patient, Correct Site, Correct Laterality, Correct Procedure, Correct Position, site marked, Risks and benefits discussed,  Surgical consent,  Pre-op evaluation,  At surgeon's request and post-op pain management  Laterality: Right and Lower  Prep: Maximum Sterile Barrier Precautions used, chloraprep       Needles:  Injection technique: Single-shot  Needle Type: Echogenic Stimulator Needle     Needle Length: 10cm      Additional Needles:   Procedures:,,,, ultrasound used (permanent image in chart),,,,  Narrative:  Start time: 04/20/2019 8:06 AM End time: 04/20/2019 8:20 AM Injection made incrementally with aspirations every 5 mL.  Performed by: Personally  Anesthesiologist: Montez Hageman, MD  Additional Notes: Risks, benefits and alternative to block explained extensively.  Patient tolerated procedure well, without complications.

## 2019-04-20 NOTE — Interval H&P Note (Signed)
History and Physical Interval Note:  04/20/2019 7:08 AM  Patricia Simpson  has presented today for surgery, with the diagnosis of HAMMER TOE ,AFTERCARE HARDWARE REMOVAL.  The various methods of treatment have been discussed with the patient and family. After consideration of risks, benefits and other options for treatment, the patient has consented to  Procedure(s) with comments: HAMMER TOE CORRECTION SECOND THRU FIFTH (Right) - GENERAL ANESTHESIA WITH BLOCK HALLUX INTERPHALANGEAL FUSION (Right) HARDWARE REMOVAL (Right) GASTROC RECESSION EXTREMITY (Right) CAPSULOTOMY FIRST RIGHT (Right) as a surgical intervention.  The patient's history has been reviewed, patient examined, no change in status, stable for surgery.  I have reviewed the patient's chart and labs.  Questions were answered to the patient's satisfaction.     Evelina Bucy

## 2019-04-20 NOTE — Anesthesia Procedure Notes (Signed)
Anesthesia Regional Block: Popliteal block   Pre-Anesthetic Checklist: ,, timeout performed, Correct Patient, Correct Site, Correct Laterality, Correct Procedure, Correct Position, site marked, Risks and benefits discussed,  Surgical consent,  Pre-op evaluation,  At surgeon's request and post-op pain management  Laterality: Right and Lower  Prep: Maximum Sterile Barrier Precautions used, chloraprep       Needles:  Injection technique: Single-shot  Needle Type: Echogenic Stimulator Needle     Needle Length: 10cm      Additional Needles:   Procedures:, nerve stimulator,,, ultrasound used (permanent image in chart),,,,  Narrative:  Start time: 04/20/2019 8:00 AM End time: 04/20/2019 8:05 AM Injection made incrementally with aspirations every 5 mL.  Performed by: Personally  Anesthesiologist: Montez Hageman, MD  Additional Notes: Risks, benefits and alternative to block explained extensively.  Patient tolerated procedure well, without complications.

## 2019-04-20 NOTE — Transfer of Care (Signed)
Immediate Anesthesia Transfer of Care Note  Patient: Patricia Simpson  Procedure(s) Performed: Procedure(s) (LRB): HAMMER TOE CORRECTION SECOND THRU FIFTH MANIPULATION OF ANKLE UNDER ANESTHESIA (Right) HALLUX INTERPHALANGEAL FUSION (Right) HARDWARE REMOVAL (Right) GASTROC RECESSION EXTREMITY (Right) CAPSULOTOMY FIRST RIGHT (Right)  Patient Location: PACU  Anesthesia Type: General  Level of Consciousness: awake, oriented, sedated and patient cooperative  Airway & Oxygen Therapy: Patient Spontanous Breathing and Patient connected to face mask oxygen  Post-op Assessment: Report given to PACU RN and Post -op Vital signs reviewed and stable  Post vital signs: Reviewed and stable  Complications: No apparent anesthesia complications Last Vitals:  Vitals Value Taken Time  BP    Temp    Pulse 112 04/20/19 1202  Resp 9 04/20/19 1202  SpO2 100 % 04/20/19 1202  Vitals shown include unvalidated device data.  Last Pain:  Vitals:   04/20/19 0647  TempSrc: Oral      Patients Stated Pain Goal: 3 (04/20/19 4709)

## 2019-04-20 NOTE — Anesthesia Preprocedure Evaluation (Addendum)
Anesthesia Evaluation  Patient identified by MRN, date of birth, ID band Patient awake    Reviewed: Allergy & Precautions, H&P , NPO status , Patient's Chart, lab work & pertinent test results, reviewed documented beta blocker date and time   Airway Mallampati: II  TM Distance: >3 FB Neck ROM: full    Dental no notable dental hx.  Upper bridge:   Pulmonary asthma , COPD,    Pulmonary exam normal breath sounds clear to auscultation       Cardiovascular hypertension, On Medications  Rhythm:regular Rate:Normal     Neuro/Psych Essential tremor negative psych ROS   GI/Hepatic negative GI ROS, (+) Hepatitis - (treated), C  Endo/Other  negative endocrine ROS  Renal/GU negative Renal ROS  negative genitourinary   Musculoskeletal negative musculoskeletal ROS (+)   Abdominal   Peds negative pediatric ROS (+)  Hematology negative hematology ROS (+)   Anesthesia Other Findings HTN Asthma Hep C  Reproductive/Obstetrics negative OB ROS                            Anesthesia Physical  Anesthesia Plan  ASA: II  Anesthesia Plan: General   Post-op Pain Management:  Regional for Post-op pain   Induction: Intravenous  PONV Risk Score and Plan: 3 and Ondansetron, Dexamethasone and Treatment may vary due to age or medical condition  Airway Management Planned: LMA  Additional Equipment:   Intra-op Plan:   Post-operative Plan:   Informed Consent: I have reviewed the patients History and Physical, chart, labs and discussed the procedure including the risks, benefits and alternatives for the proposed anesthesia with the patient or authorized representative who has indicated his/her understanding and acceptance.     Dental Advisory Given and Dental advisory given  Plan Discussed with: CRNA and Surgeon  Anesthesia Plan Comments: (  )        Anesthesia Quick Evaluation

## 2019-04-20 NOTE — H&P (View-Only) (Signed)
Subjective:  Patient ID: Patricia Simpson, female    DOB: [birth year],  MRN: [id number]  Chief Complaint  Patient presents with  . Hammer Toe    surgical consult - [facility] referral    58 y.o. female presents with the above complaint.  Previous scheduled for surgery with Dr. Milinda Pointer however she needed to have surgery at the hospital and was referred for evaluation.  Still having pain in the right great toe joint with feeling of stiffness.  Thinks that the second third fourth and fifth toes are bending.  Feels that the muscles in her foot are tight.  Review of Systems: Negative except as noted in the HPI. Denies N/V/F/Ch.  Past Medical History:  Diagnosis Date  . Acquired deformity of right foot    s/p  hammertoe correction 06-02-2018  . Anxiety    "nervous episodes",   . Asthma    no inhaler  . Complication of anesthesia    2004- had hives post anesth. , ?relative to anesth. or Morphine  . Costochondritis 03/2019   pt had chest pain and DOE,  had cardiology consult dx costochondritis  . DDD (degenerative disc disease), cervical   . Dental bridge present    upper  . Essential tremor    bilateral hands ,  pt states residual from [place]  . GERD (gastroesophageal reflux disease)    occasional , no meds  . Headache(784.0)    migraines - unknown triggers  . Heart murmur   . History of hepatitis C followed by [facility] in [place] (notes in epic under media)   dx 2002 (per pt from blood transfusion's received 1986) ;  treated w/ medication and cured 2016  . Hypertension   . Left arm weakness    w/ numbness/ tingleing , per pt residual from [place]  . MVP (mitral valve prolapse)    cardiologist-- dr Einar Gip--- dx via echo 03-27-2019  mild MVP with moderate MR  . Raynaud's disease   . Seasonal allergies   . Wears glasses    No current facility-administered medications for this visit.  No current outpatient medications on file.  Facility-Administered Medications Ordered in  Other Visits:  .  clindamycin (CLEOCIN) IVPB 900 mg, 900 mg, Intravenous, On Call to OR, , Christian Mate, DPM .  lactated ringers infusion, , Intravenous, Continuous, Myrtie Soman, MD  Social History   Tobacco Use  Smoking Status Never Smoker  Smokeless Tobacco Never Used    Allergies  Allergen Reactions  . Amoxicillin Hives and Itching    Has patient had a PCN reaction causing immediate rash, facial/tongue/throat swelling, SOB or lightheadedness with hypotension: no Has patient had a PCN reaction causing severe rash involving mucus membranes or skin necrosis: no Has patient had a PCN reaction that required hospitalization: was already in the hospital Has patient had a PCN reaction occurring within the last 10 years: yes  . Vesicare [Solifenacin] Itching and Other (See Comments)    vertigo  . Wax [Beeswax] Hives and Itching  . Morphine And Related Hives  . Penicillins Hives    Has patient had a PCN reaction causing immediate rash, facial/tongue/throat swelling, SOB or lightheadedness with hypotension: no Has patient had a PCN reaction causing severe rash involving mucus membranes or skin necrosis: no Has patient had a PCN reaction that required hospitalization: was already in the hospital Has patient had a PCN reaction occurring within the last 10 years: yes If all of the above answers are "NO", then  may proceed with Cephalosporin use.   . Sulfa Antibiotics Hives   Objective:  There were no vitals filed for this visit. There is no height or weight on file to calculate BMI. Constitutional Well developed. Well nourished.  Vascular Dorsalis pedis pulses palpable bilaterally. Posterior tibial pulses palpable bilaterally. Capillary refill normal to all digits.  No cyanosis or clubbing noted. Pedal hair growth normal.  Neurologic Normal speech. Oriented to person, place, and time. Epicritic sensation to light touch grossly present bilaterally.  Dermatologic Nails well groomed  and normal in appearance. No open wounds. No skin lesions.  Orthopedic:  Right first MPJ slight stiffness with extensor to tendon contracture, hallux IPJ crepitus deformity.  Residual hammertoes 234 and 5, extensor tendon contractures, gastroc equinus with positive Silverskiold test.  Pain to palpation of the hammertoes, pain to the first metatarsophalangeal joint   Radiographs: Prior x-rays reviewed Assessment:   1. Capsulitis of foot, right   2. Hammer toe of right foot   3. Hav (hallux abducto valgus), right   4. Capsulitis of right foot   5. Equinus deformity of foot    Plan:  Patient was evaluated and treated and all questions answered.  Right first metatarsophalangeal joint fibrosis, retained hardware, hallux IPJ arthritis, tendon contracture, residual hammertoe deformities, gastroc equinus -Discussed expected goals outcome of the surgery.  I concur with Dr. Stephenie Acres surgical plan however I additionally discussed that due to the tightness in her calf she would additionally benefit from gastroc recession. -Patient has failed all conservative therapy and wishes to proceed with surgical intervention. All risks, benefits, and alternatives discussed with patient. No guarantees given. Consent reviewed and signed by patient. -Planned procedures: Right first metatarsal phalangeal joint release, first metatarsal removal of hardware, hallux interphalangeal joint fusion, possible extensor tendon lengthening, revision of hammertoes 234 with screw vs implant, revision of fifth hammertoe, gastroc lengthening  30 minutes of face to face time were spent with the patient. >50% of this was spent on counseling and coordination of care. Specifically discussed with patient the above diagnoses and overall treatment plan.   No follow-ups on file.

## 2019-04-20 NOTE — Anesthesia Procedure Notes (Signed)
Procedure Name: LMA Insertion Date/Time: 04/20/2019 8:43 AM Performed by: Bufford Spikes, CRNA Pre-anesthesia Checklist: Patient identified, Emergency Drugs available, Suction available and Patient being monitored Patient Re-evaluated:Patient Re-evaluated prior to induction Oxygen Delivery Method: Circle system utilized Preoxygenation: Pre-oxygenation with 100% oxygen Induction Type: IV induction Ventilation: Mask ventilation without difficulty LMA: LMA inserted LMA Size: 3.0 Number of attempts: 1 Airway Equipment and Method: Bite block Placement Confirmation: positive ETCO2 Tube secured with: Tape Dental Injury: Teeth and Oropharynx as per pre-operative assessment

## 2019-04-26 ENCOUNTER — Other Ambulatory Visit: Payer: BC Managed Care – PPO

## 2019-04-26 ENCOUNTER — Encounter (HOSPITAL_BASED_OUTPATIENT_CLINIC_OR_DEPARTMENT_OTHER): Payer: Self-pay | Admitting: Podiatry

## 2019-04-26 ENCOUNTER — Ambulatory Visit (INDEPENDENT_AMBULATORY_CARE_PROVIDER_SITE_OTHER): Payer: BC Managed Care – PPO | Admitting: Podiatry

## 2019-04-26 ENCOUNTER — Other Ambulatory Visit: Payer: Self-pay

## 2019-04-26 ENCOUNTER — Ambulatory Visit (INDEPENDENT_AMBULATORY_CARE_PROVIDER_SITE_OTHER): Payer: BC Managed Care – PPO

## 2019-04-26 VITALS — Temp 98.7°F

## 2019-04-26 DIAGNOSIS — M2041 Other hammer toe(s) (acquired), right foot: Secondary | ICD-10-CM | POA: Diagnosis not present

## 2019-05-02 ENCOUNTER — Ambulatory Visit: Payer: BC Managed Care – PPO | Admitting: Cardiology

## 2019-05-03 ENCOUNTER — Ambulatory Visit (INDEPENDENT_AMBULATORY_CARE_PROVIDER_SITE_OTHER): Payer: BC Managed Care – PPO | Admitting: Podiatry

## 2019-05-03 ENCOUNTER — Other Ambulatory Visit: Payer: Self-pay

## 2019-05-03 DIAGNOSIS — Z9889 Other specified postprocedural states: Secondary | ICD-10-CM

## 2019-05-03 NOTE — Progress Notes (Signed)
Subjective:  Patient ID: Patricia Simpson, female    DOB: [birth year],  MRN: [id number]  Chief Complaint  Patient presents with  . Routine Post Op    6.26.2020 Removal Hardware hammertoe repair  right foot 2-5. Pt states doing well, no concerns.     DOS: 04/20/2019 Procedure: Capsulotomy right first MPJ with removal of arthrofibrosis, hardware removal first metatarsal, hallux interphalangeal fusion, correction of hammertoes 234 and 5, gastrocnemius recession with manipulation of ankle under anesthesia  58 y.o. female returns for post-op check.  Doing well only taking ibuprofen for pain not having to take the narcotic pain medication.  Denies postop concerns.  Review of Systems: Negative except as noted in the HPI. Denies N/V/F/Ch.  Past Medical History:  Diagnosis Date  . Acquired deformity of right foot    s/p  hammertoe correction 06-02-2018  . Anxiety    "nervous episodes",   . Asthma    no inhaler  . Complication of anesthesia    2004- had hives post anesth. , ?relative to anesth. or Morphine  . Costochondritis 03/2019   pt had chest pain and DOE,  had cardiology consult dx costochondritis  . DDD (degenerative disc disease), cervical   . Dental bridge present    upper  . Essential tremor    bilateral hands ,  pt states residual from [place]  . GERD (gastroesophageal reflux disease)    occasional , no meds  . Headache(784.0)    migraines - unknown triggers  . Heart murmur   . History of hepatitis C followed by [facility] in [place] (notes in epic under media)   dx 2002 (per pt from blood transfusion's received 1986) ;  treated w/ medication and cured 2016  . Hypertension   . Left arm weakness    w/ numbness/ tingleing , per pt residual from [place]  . MVP (mitral valve prolapse)    cardiologist-- dr Einar Gip--- dx via echo 03-27-2019  mild MVP with moderate MR  . Raynaud's disease   . Seasonal allergies   . Wears glasses     Current Outpatient Medications:  .   amLODipine (NORVASC) 10 MG tablet, Take 1 tablet (10 mg total) by mouth daily. (Patient taking differently: Take 10 mg by mouth daily. ), Disp: 90 tablet, Rfl: 3 .  Azilsartan Medoxomil (EDARBI) 40 MG TABS, Take 40 mg by mouth daily. , Disp: , Rfl:  .  cetirizine (ZYRTEC) 10 MG tablet, Take 10 mg by mouth as needed. , Disp: , Rfl: 0 .  clindamycin (CLEOCIN) 150 MG capsule, Take 1 capsule (150 mg total) by mouth 2 (two) times daily., Disp: 14 capsule, Rfl: 0 .  DULoxetine (CYMBALTA) 60 MG capsule, Take 60 mg by mouth 2 (two) times daily., Disp: , Rfl:  .  estradiol (ESTRACE) 2 MG tablet, Take 2 mg by mouth daily., Disp: , Rfl: 1 .  fluconazole (DIFLUCAN) 150 MG tablet, fluconazole 150 mg tablet, Disp: , Rfl:  .  HYDROmorphone (DILAUDID) 2 MG tablet, Take 1 tablet (2 mg total) by mouth every 4 (four) hours as needed for severe pain., Disp: 20 tablet, Rfl: 0 .  ibuprofen (ADVIL) 800 MG tablet, ibuprofen 800 mg tablet, Disp: , Rfl:  .  INTRAROSA 6.5 MG INST, PLACE 1 INSERT VAGINALLY QD, Disp: , Rfl:  .  levofloxacin (LEVAQUIN) 750 MG tablet, levofloxacin 750 mg tablet, Disp: , Rfl:  .  oxybutynin (DITROPAN-XL) 10 MG 24 hr tablet, Take 10 mg by mouth daily. ,  Disp: , Rfl:  .  Vitamin D, Ergocalciferol, (DRISDOL) 50000 units CAPS capsule, Take 50,000 Units by mouth every 7 (seven) days. , Disp: , Rfl: 2  Social History   Tobacco Use  Smoking Status Never Smoker  Smokeless Tobacco Never Used    Allergies  Allergen Reactions  . Amoxicillin Hives and Itching    Has patient had a PCN reaction causing immediate rash, facial/tongue/throat swelling, SOB or lightheadedness with hypotension: no Has patient had a PCN reaction causing severe rash involving mucus membranes or skin necrosis: no Has patient had a PCN reaction that required hospitalization: was already in the hospital Has patient had a PCN reaction occurring within the last 10 years: yes  . Vesicare [Solifenacin] Itching and Other (See  Comments)    vertigo  . Wax [Beeswax] Hives and Itching  . Morphine And Related Hives  . Penicillins Hives    Has patient had a PCN reaction causing immediate rash, facial/tongue/throat swelling, SOB or lightheadedness with hypotension: no Has patient had a PCN reaction causing severe rash involving mucus membranes or skin necrosis: no Has patient had a PCN reaction that required hospitalization: was already in the hospital Has patient had a PCN reaction occurring within the last 10 years: yes If all of the above answers are "NO", then may proceed with Cephalosporin use.   . Sulfa Antibiotics Hives   Objective:   Vitals:   04/26/19 1522  Temp: 98.7 F (37.1 C)   There is no height or weight on file to calculate BMI. Constitutional Well developed. Well nourished.  Vascular Foot warm and well perfused. Capillary refill normal to all digits.   Neurologic Normal speech. Oriented to person, place, and time. Epicritic sensation to light touch grossly present bilaterally.  Dermatologic Skin healing well without signs of infection. Skin edges well coapted without signs of infection.  Orthopedic: Tenderness to palpation noted about the surgical site.  Toes in good alignment second toe appears slightly plantarflexed at the PIPJ   Radiographs: Taken reviewed consistent with postop state hardware appears intact toes in good alignment arthrodesis of hallux in good alignment Assessment:   1. Hammer toe of right foot    Plan:  Patient was evaluated and treated and all questions answered.  S/p foot surgery right -Progressing as expected post-operatively.  Patient pleased with appearance of foot.  Pain controlled. -XR: As above.  The second toe did appear slightly plantarflexed today however this was able to manually reduced.  I think ultimately this will not cause her pain however cosmetically she may not like that the second toe slightly bent while the third toe was straight and the fourth  toe and fifth toes.  Anatomically -WB Status: WBAT in walking boot -Sutures: Intact. -Medications: none refilled -Foot redressed.  Toes individually splinted in straight position with Betadine splint  No follow-ups on file.

## 2019-05-10 ENCOUNTER — Ambulatory Visit (INDEPENDENT_AMBULATORY_CARE_PROVIDER_SITE_OTHER): Payer: Self-pay | Admitting: Podiatry

## 2019-05-10 ENCOUNTER — Other Ambulatory Visit: Payer: Self-pay

## 2019-05-10 ENCOUNTER — Other Ambulatory Visit: Payer: BC Managed Care – PPO

## 2019-05-10 DIAGNOSIS — Z9889 Other specified postprocedural states: Secondary | ICD-10-CM

## 2019-05-24 NOTE — Op Note (Signed)
Patient Name: Patricia Simpson DOB: 1961/10/10  MRN: [id number]   Date of Service: 04/20/2019  Surgeon: Dr. Hardie Pulley, DPM Assistants: None Pre-operative Diagnosis:  Hallux malleus, first MPJ capsulitis arthritis, hammertoe deformities, equinus deformity Post-operative Diagnosis:  Same plus arthrofibrosis first metatarsophalangeal joint Procedures:  1) Right Hallux IPJ Fusion  2) Right first MPJ capsulotomy  3) Right second hammertoe correction  4) Right third hammertoe correction  5) Right fourth hammertoe correction  6)Right fifth hammertoe correct  7) Gastrocnemius recession right  8) Manipulation of ankle anesthesia Pathology/Specimens: * No specimens in log * Anesthesia: General Hemostasis:  Total Tourniquet Time Documented: Thigh (Right) - 120 minutes Total: Thigh (Right) - 120 minutes  Estimated Blood Loss: 25 mL Materials:  Implant Name Type Inv. Item Serial No. Manufacturer Lot No. LRB No. Used Action  STAPLE FIXATION 8X8X8 - [id number] Staple STAPLE FIXATION 8X8X8  [employer] [id number] Right 1 Implanted  STAPLE FIXATION 8X8X8 - [id number] Staple STAPLE FIXATION 8X8X8  [employer] [id number] Right 1 Implanted  IMPLANT TOETAC XPRESS MED - [id number] Miscellaneous IMPLANT TOETAC XPRESS MED  [employer] [id number] Right 1 Implanted  IMPLANT TOETAC XPRESS SM - [id number] Miscellaneous IMPLANT TOETAC XPRESS SM  [employer] [id number] Right 1 Implanted  SCREW CAN ACUTRAK 2.5-2.8X28MM - [id number] Screw SCREW CAN ACUTRAK 2.5-2.8X28MM  [place] [id number] Right 1 Implanted   Medications: None Complications: None  Indications for Procedure:  This is a 58 y.o. female who previously underwent bunion hammertoe correction with residual bunion and hammertoe pain.  She presents for surgical revision.  If discussed the patient she would benefit from operative intervention after failing all conservative therapy   Procedure in Detail: Patient was identified in  pre-operative holding area. Formal consent was signed and the right lower extremity was marked. Patient was brought back to the operating room. Anesthesia was induced. The extremity was prepped and draped in the usual sterile fashion. Timeout was taken to confirm patient name, laterality, and procedure prior to incision.   Attention was first directed to the right ankle.  The ankle was only able dorsiflex to 0 degrees.  An incision was made in the posterior aspect of the right calf four fingers distal to the muscle belly of the gastrocnemius.  Dissection was carried down through skin and subcu tissue with care to avoid all vital neurovascular structures all bleeders were cauterized electrocautery.  Blunt dissection was performed down to level of the gastric fascia.  The sural nerve was identified and gently retracted.  An incision was made in the gastroc fascia exposing underlying muscle belly.  The fascia was then divided both medially and laterally.  The ankle was then thoroughly manipulated with repeated dorsiflexion and plantarflexion to break up capsular adhesions and to stretch the posterior musculature.  Post thorough manipulation the ankle was able to be brought to 10 degrees of dorsiflexion.  Attention was then directed to the right first metatarsophalangeal joint a linear incision was made over the area of her previous incision.  Dissection was carried down to the level of the joint capsule with care to avoid all vital neurovascular structures.  A linear incision was made in the joint capsule.  The first metatarsal phalangeal joint did appear to be fibrous.  A McClamary elevator was used to release capsular attachments and improved range of motion the first metatarsal phalangeal joint was noted.  The first metatarsophalangeal joint was able to be brought through its full range of motion without restriction.  Attention was then directed  to the hallux interphalangeal joint.  A linear incision was  made in the hallux interphalangeal joint capsule and the capsule was then gently freed from the bone.  A sagittal saw was used to excise the cartilaginous portion of both sides of the proximal and distal phalanges.  The toe was then placed in straight position and the arthrodesis site was fixated with 2 Stryker 8 x 8 x 8 fixation staples.  Fluoroscopy was used to confirm adequate positioning.  Attention was then directed to the second toe.  A linear incision was made in the second toe centered over the proximal interphalangeal joint dissection was carried down to level of the proximal phalangeal joint.  The guidewire for the toe tach kit was fired down the proximal phalangeal shaft and the proximal phalanx was reamed.  The middle phalanx was similarly prepared in this manner with a corresponding middle phalanx reamer.  The reamers were removed and the toe tack implant was applied with good seating noted.  Attention was then directed to the third toe. A linear incision was made in the second toe centered over the proximal interphalangeal joint dissection was carried down to level of the proximal phalangeal joint. The guidewire for the toe tach kit was fired down the proximal phalangeal shaft and the proximal phalanx was reamed.  The middle phalanx was similarly prepared in this manner with a corresponding middle phalanx reamer.  The reamers were removed and the toe tack implant was applied however the implant did break during application.  At this point the implant was removed and screw fixation was chosen instead.  A guidewire was fired from the middle phalanx distally at the toe and back onto the proximal phalanx with reduction of the hammertoe deformity.  Once fluoroscopy shoes was taken to show adequate positioning an incision was made at the tip of the toe and the distal phalanx was countersunk and a AccuTrack screw was placed over the guidewire.  Good compression across the proximal interphalangeal joint  was noted.  Attention was then directed to the fourth toe. A linear incision was made in the second toe centered over the proximal interphalangeal joint dissection was carried down to level of the proximal phalangeal joint.  A sagittal saw was used to resect the middle phalangeal base and proximal phalangeal head with reduction of the hammertoe deformity noted.  Attention was then directed to the fifth toe. A linear incision was made in the second toe centered over the proximal interphalangeal joint dissection was carried down to level of the proximal phalangeal joint.  A sagittal saw was used to resect the middle phalangeal base and proximal phalangeal head with reduction of the hammertoe deformity noted.  All incisions were then copiously irrigated.  The first metatarsophalangeal joint incision was then closed in layers of 2030 4-0 Monocryl and 4-0 nylon.  Each toe was closed with 3-0 Monocryl and 4-0 nylon.  The foot was then dressed with Xeroform 4 x 4 Kerlix and Ace bandage. Patient tolerated the procedure well.   Disposition: Following a period of post-operative monitoring, patient will be transferred home.

## 2019-05-25 ENCOUNTER — Other Ambulatory Visit: Payer: Self-pay

## 2019-05-25 ENCOUNTER — Ambulatory Visit (INDEPENDENT_AMBULATORY_CARE_PROVIDER_SITE_OTHER): Payer: Self-pay | Admitting: Podiatry

## 2019-05-25 ENCOUNTER — Ambulatory Visit (INDEPENDENT_AMBULATORY_CARE_PROVIDER_SITE_OTHER): Payer: BC Managed Care – PPO

## 2019-05-25 DIAGNOSIS — M2041 Other hammer toe(s) (acquired), right foot: Secondary | ICD-10-CM | POA: Diagnosis not present

## 2019-05-25 DIAGNOSIS — M79671 Pain in right foot: Secondary | ICD-10-CM

## 2019-05-25 NOTE — Progress Notes (Signed)
Subjective:  Patient ID: Patricia Simpson, female    DOB: 03/17/1961,  MRN: [id number]  Chief Complaint  Patient presents with  . Routine Post Op    " my 5th toe is hurting really badly, and I dont think these stiches are ready to come out yet"     DOS: 04/20/2019 Procedure: Capsulotomy right first MPJ with removal of arthrofibrosis, hardware removal first metatarsal, hallux interphalangeal fusion, correction of hammertoes 234 and 5, gastrocnemius recession with manipulation of ankle under anesthesia  58 y.o. female returns for post-op check. Hx as above.  Review of Systems: Negative except as noted in the HPI. Denies N/V/F/Ch.  Past Medical History:  Diagnosis Date  . Acquired deformity of right foot    s/p  hammertoe correction 06-02-2018  . Anxiety    "nervous episodes",   . Asthma    no inhaler  . Complication of anesthesia    2004- had hives post anesth. , ?relative to anesth. or Morphine  . Costochondritis 03/2019   pt had chest pain and DOE,  had cardiology consult dx costochondritis  . DDD (degenerative disc disease), cervical   . Dental bridge present    upper  . Essential tremor    bilateral hands ,  pt states residual from [place]  . GERD (gastroesophageal reflux disease)    occasional , no meds  . Headache(784.0)    migraines - unknown triggers  . Heart murmur   . History of hepatitis C followed by [facility] in [place] (notes in epic under media)   dx 2002 (per pt from blood transfusion's received 1986) ;  treated w/ medication and cured 2016  . Hypertension   . Left arm weakness    w/ numbness/ tingleing , per pt residual from [place]  . MVP (mitral valve prolapse)    cardiologist-- dr Einar Gip--- dx via echo 03-27-2019  mild MVP with moderate MR  . Raynaud's disease   . Seasonal allergies   . Wears glasses     Current Outpatient Medications:  .  amLODipine (NORVASC) 10 MG tablet, Take 1 tablet (10 mg total) by mouth daily. (Patient taking  differently: Take 10 mg by mouth daily. ), Disp: 90 tablet, Rfl: 3 .  Azilsartan Medoxomil (EDARBI) 40 MG TABS, Take 40 mg by mouth daily. , Disp: , Rfl:  .  cetirizine (ZYRTEC) 10 MG tablet, Take 10 mg by mouth as needed. , Disp: , Rfl: 0 .  clindamycin (CLEOCIN) 150 MG capsule, Take 1 capsule (150 mg total) by mouth 2 (two) times daily., Disp: 14 capsule, Rfl: 0 .  DULoxetine (CYMBALTA) 60 MG capsule, Take 60 mg by mouth 2 (two) times daily., Disp: , Rfl:  .  estradiol (ESTRACE) 2 MG tablet, Take 2 mg by mouth daily., Disp: , Rfl: 1 .  fluconazole (DIFLUCAN) 150 MG tablet, fluconazole 150 mg tablet, Disp: , Rfl:  .  HYDROmorphone (DILAUDID) 2 MG tablet, Take 1 tablet (2 mg total) by mouth every 4 (four) hours as needed for severe pain., Disp: 20 tablet, Rfl: 0 .  ibuprofen (ADVIL) 800 MG tablet, ibuprofen 800 mg tablet, Disp: , Rfl:  .  INTRAROSA 6.5 MG INST, PLACE 1 INSERT VAGINALLY QD, Disp: , Rfl:  .  levofloxacin (LEVAQUIN) 750 MG tablet, levofloxacin 750 mg tablet, Disp: , Rfl:  .  oxybutynin (DITROPAN-XL) 10 MG 24 hr tablet, Take 10 mg by mouth daily. , Disp: , Rfl:  .  Vitamin D, Ergocalciferol, (DRISDOL) 50000 units  CAPS capsule, Take 50,000 Units by mouth every 7 (seven) days. , Disp: , Rfl: 2  Social History   Tobacco Use  Smoking Status Never Smoker  Smokeless Tobacco Never Used    Allergies  Allergen Reactions  . Amoxicillin Hives and Itching    Has patient had a PCN reaction causing immediate rash, facial/tongue/throat swelling, SOB or lightheadedness with hypotension: no Has patient had a PCN reaction causing severe rash involving mucus membranes or skin necrosis: no Has patient had a PCN reaction that required hospitalization: was already in the hospital Has patient had a PCN reaction occurring within the last 10 years: yes  . Vesicare [Solifenacin] Itching and Other (See Comments)    vertigo  . Wax [Beeswax] Hives and Itching  . Morphine And Related Hives  .  Penicillins Hives    Has patient had a PCN reaction causing immediate rash, facial/tongue/throat swelling, SOB or lightheadedness with hypotension: no Has patient had a PCN reaction causing severe rash involving mucus membranes or skin necrosis: no Has patient had a PCN reaction that required hospitalization: was already in the hospital Has patient had a PCN reaction occurring within the last 10 years: yes If all of the above answers are "NO", then may proceed with Cephalosporin use.   . Sulfa Antibiotics Hives   Objective:   There were no vitals filed for this visit. There is no height or weight on file to calculate BMI. Constitutional Well developed. Well nourished.  Vascular Foot warm and well perfused. Capillary refill normal to all digits.   Neurologic Normal speech. Oriented to person, place, and time. Epicritic sensation to light touch grossly present bilaterally.  Dermatologic Skin healing well without signs of infection. Skin edges well coapted without signs of infection.  Orthopedic: Tenderness to palpation noted about the surgical site.  Toes in good alignment second toe appears slightly plantarflexed at the PIPJ   Radiographs: None Assessment:   1. Post-operative state    Plan:  Patient was evaluated and treated and all questions answered.  S/p foot surgery right -Progressing as expected post-operatively.  -XR: None -WB Status: WBAT in walking boot -Sutures: removed. -Medications: none refilled -Foot redressed.   No follow-ups on file.

## 2019-05-27 NOTE — Progress Notes (Signed)
Subjective:  Patient ID: Patricia Simpson, female    DOB: 1960-11-23,  MRN: [id number]  No chief complaint on file.   DOS: 04/20/2019 Procedure: Capsulotomy right first MPJ with removal of arthrofibrosis, hardware removal first metatarsal, hallux interphalangeal fusion, correction of hammertoes 234 and 5, gastrocnemius recession with manipulation of ankle under anesthesia  58 y.o. female returns for post-op check.  Came in today without her shoe despite being restricted to weightbearing in the shoe..  Not having any pain while walking without her boot.  Denies complaints only having pain in her fifth toe otherwise  Review of Systems: Negative except as noted in the HPI. Denies N/V/F/Ch.  Past Medical History:  Diagnosis Date  . Acquired deformity of right foot    s/p  hammertoe correction 06-02-2018  . Anxiety    "nervous episodes",   . Asthma    no inhaler  . Complication of anesthesia    2004- had hives post anesth. , ?relative to anesth. or Morphine  . Costochondritis 03/2019   pt had chest pain and DOE,  had cardiology consult dx costochondritis  . DDD (degenerative disc disease), cervical   . Dental bridge present    upper  . Essential tremor    bilateral hands ,  pt states residual from [place]  . GERD (gastroesophageal reflux disease)    occasional , no meds  . Headache(784.0)    migraines - unknown triggers  . Heart murmur   . History of hepatitis C followed by [facility] in [place] (notes in epic under media)   dx 2002 (per pt from blood transfusion's received 1986) ;  treated w/ medication and cured 2016  . Hypertension   . Left arm weakness    w/ numbness/ tingleing , per pt residual from [place]  . MVP (mitral valve prolapse)    cardiologist-- dr Einar Gip--- dx via echo 03-27-2019  mild MVP with moderate MR  . Raynaud's disease   . Seasonal allergies   . Wears glasses     Current Outpatient Medications:  .  amLODipine (NORVASC) 10 MG tablet, Take 1  tablet (10 mg total) by mouth daily. (Patient taking differently: Take 10 mg by mouth daily. ), Disp: 90 tablet, Rfl: 3 .  Azilsartan Medoxomil (EDARBI) 40 MG TABS, Take 40 mg by mouth daily. , Disp: , Rfl:  .  cetirizine (ZYRTEC) 10 MG tablet, Take 10 mg by mouth as needed. , Disp: , Rfl: 0 .  clindamycin (CLEOCIN) 150 MG capsule, Take 1 capsule (150 mg total) by mouth 2 (two) times daily., Disp: 14 capsule, Rfl: 0 .  DULoxetine (CYMBALTA) 60 MG capsule, Take 60 mg by mouth 2 (two) times daily., Disp: , Rfl:  .  estradiol (ESTRACE) 2 MG tablet, Take 2 mg by mouth daily., Disp: , Rfl: 1 .  fluconazole (DIFLUCAN) 150 MG tablet, fluconazole 150 mg tablet, Disp: , Rfl:  .  HYDROmorphone (DILAUDID) 2 MG tablet, Take 1 tablet (2 mg total) by mouth every 4 (four) hours as needed for severe pain., Disp: 20 tablet, Rfl: 0 .  ibuprofen (ADVIL) 800 MG tablet, ibuprofen 800 mg tablet, Disp: , Rfl:  .  INTRAROSA 6.5 MG INST, PLACE 1 INSERT VAGINALLY QD, Disp: , Rfl:  .  levofloxacin (LEVAQUIN) 750 MG tablet, levofloxacin 750 mg tablet, Disp: , Rfl:  .  oxybutynin (DITROPAN-XL) 10 MG 24 hr tablet, Take 10 mg by mouth daily. , Disp: , Rfl:  .  Vitamin D, Ergocalciferol, (DRISDOL)  50000 units CAPS capsule, Take 50,000 Units by mouth every 7 (seven) days. , Disp: , Rfl: 2  Social History   Tobacco Use  Smoking Status Never Smoker  Smokeless Tobacco Never Used    Allergies  Allergen Reactions  . Amoxicillin Hives and Itching    Has patient had a PCN reaction causing immediate rash, facial/tongue/throat swelling, SOB or lightheadedness with hypotension: no Has patient had a PCN reaction causing severe rash involving mucus membranes or skin necrosis: no Has patient had a PCN reaction that required hospitalization: was already in the hospital Has patient had a PCN reaction occurring within the last 10 years: yes  . Vesicare [Solifenacin] Itching and Other (See Comments)    vertigo  . Wax [Beeswax] Hives  and Itching  . Morphine And Related Hives  . Penicillins Hives    Has patient had a PCN reaction causing immediate rash, facial/tongue/throat swelling, SOB or lightheadedness with hypotension: no Has patient had a PCN reaction causing severe rash involving mucus membranes or skin necrosis: no Has patient had a PCN reaction that required hospitalization: was already in the hospital Has patient had a PCN reaction occurring within the last 10 years: yes If all of the above answers are "NO", then may proceed with Cephalosporin use.   . Sulfa Antibiotics Hives   Objective:   There were no vitals filed for this visit. There is no height or weight on file to calculate BMI. Constitutional Well developed. Well nourished.  Vascular Foot warm and well perfused. Capillary refill normal to all digits.   Neurologic Normal speech. Oriented to person, place, and time. Epicritic sensation to light touch grossly present bilaterally.  Dermatologic Skin well-healed  Orthopedic: Toes in good alignment except for second toe appears slightly plantarflexed at the PIPJ.  Fifth toe with pain to palpation   Radiographs: Taken and reviewed bridging across the hallux IPJ noted Assessment:   1. Pain in right foot   2. Hammer toe of right foot    Plan:  Patient was evaluated and treated and all questions answered.  S/p foot surgery right -Progressing as expected post-operatively.  Only having tenderness at the fifth toe. -XR: None -WB Status: WBAT in surgical shoe.  -Sutures: out, skin well healed. -Medications: none refilled -Foot redressed.    No follow-ups on file.

## 2019-06-05 ENCOUNTER — Ambulatory Visit: Payer: BC Managed Care – PPO | Admitting: Podiatry

## 2019-06-14 ENCOUNTER — Ambulatory Visit (INDEPENDENT_AMBULATORY_CARE_PROVIDER_SITE_OTHER): Payer: Self-pay | Admitting: Podiatry

## 2019-06-14 ENCOUNTER — Other Ambulatory Visit: Payer: Self-pay

## 2019-06-14 ENCOUNTER — Other Ambulatory Visit: Payer: Self-pay | Admitting: Podiatry

## 2019-06-14 ENCOUNTER — Ambulatory Visit (INDEPENDENT_AMBULATORY_CARE_PROVIDER_SITE_OTHER): Payer: BC Managed Care – PPO

## 2019-06-14 DIAGNOSIS — M79671 Pain in right foot: Secondary | ICD-10-CM

## 2019-06-14 DIAGNOSIS — M2041 Other hammer toe(s) (acquired), right foot: Secondary | ICD-10-CM | POA: Diagnosis not present

## 2019-07-19 ENCOUNTER — Ambulatory Visit (INDEPENDENT_AMBULATORY_CARE_PROVIDER_SITE_OTHER): Payer: BC Managed Care – PPO | Admitting: Podiatry

## 2019-07-19 ENCOUNTER — Other Ambulatory Visit: Payer: Self-pay | Admitting: Podiatry

## 2019-07-19 ENCOUNTER — Ambulatory Visit (INDEPENDENT_AMBULATORY_CARE_PROVIDER_SITE_OTHER): Payer: BC Managed Care – PPO

## 2019-07-19 ENCOUNTER — Other Ambulatory Visit: Payer: Self-pay

## 2019-07-19 DIAGNOSIS — M79671 Pain in right foot: Secondary | ICD-10-CM

## 2019-07-19 DIAGNOSIS — M2041 Other hammer toe(s) (acquired), right foot: Secondary | ICD-10-CM | POA: Diagnosis not present

## 2019-07-19 DIAGNOSIS — Z9889 Other specified postprocedural states: Secondary | ICD-10-CM

## 2019-07-19 NOTE — Progress Notes (Signed)
Subjective:  Patient ID: Patricia Simpson, female    DOB: 11/20/1960,  MRN: [id number]  No chief complaint on file.   DOS: 04/20/2019 Procedure: Capsulotomy right first MPJ with removal of arthrofibrosis, hardware removal first metatarsal, hallux interphalangeal fusion, correction of hammertoes 234 and 5, gastrocnemius recession with manipulation of ankle under anesthesia  58 y.o. female returns for post-op check. Wearing normal shoegear instead of her surgical shoe. Denies pain or issues.  Review of Systems: Negative except as noted in the HPI. Denies N/V/F/Ch.  Past Medical History:  Diagnosis Date   Acquired deformity of right foot    s/p  hammertoe correction 06-02-2018   Anxiety    "nervous episodes",    Asthma    no inhaler   Complication of anesthesia    2004- had hives post anesth. , ?relative to anesth. or Morphine   Costochondritis 03/2019   pt had chest pain and DOE,  had cardiology consult dx costochondritis   DDD (degenerative disc disease), cervical    Dental bridge present    upper   Essential tremor    bilateral hands ,  pt states residual from [place]   GERD (gastroesophageal reflux disease)    occasional , no meds   Headache(784.0)    migraines - unknown triggers   Heart murmur    History of hepatitis C followed by [facility] in [place] (notes in epic under media)   dx 2002 (per pt from blood transfusion's received 1986) ;  treated w/ medication and cured 2016   Hypertension    Left arm weakness    w/ numbness/ tingleing , per pt residual from [place]   MVP (mitral valve prolapse)    cardiologist-- dr Einar Gip--- dx via echo 03-27-2019  mild MVP with moderate MR   Raynaud's disease    Seasonal allergies    Wears glasses     Current Outpatient Medications:    ALPRAZolam (XANAX) 0.25 MG tablet, alprazolam 0.25 mg tablet  take one tablet daily as needed for anxiety, Disp: , Rfl:    amLODipine (NORVASC) 10 MG tablet, Take 1 tablet  (10 mg total) by mouth daily. (Patient taking differently: Take 10 mg by mouth daily. ), Disp: 90 tablet, Rfl: 3   Azilsartan Medoxomil (EDARBI) 40 MG TABS, Take 40 mg by mouth daily. , Disp: , Rfl:    cetirizine (ZYRTEC) 10 MG tablet, Take 10 mg by mouth as needed. , Disp: , Rfl: 0   clindamycin (CLEOCIN) 150 MG capsule, Take 1 capsule (150 mg total) by mouth 2 (two) times daily., Disp: 14 capsule, Rfl: 0   DULoxetine (CYMBALTA) 60 MG capsule, Take 60 mg by mouth 2 (two) times daily., Disp: , Rfl:    estradiol (ESTRACE) 2 MG tablet, Take 2 mg by mouth daily., Disp: , Rfl: 1   fluconazole (DIFLUCAN) 150 MG tablet, fluconazole 150 mg tablet, Disp: , Rfl:    HYDROmorphone (DILAUDID) 2 MG tablet, Take 1 tablet (2 mg total) by mouth every 4 (four) hours as needed for severe pain., Disp: 20 tablet, Rfl: 0   ibuprofen (ADVIL) 800 MG tablet, ibuprofen 800 mg tablet, Disp: , Rfl:    INTRAROSA 6.5 MG INST, PLACE 1 INSERT VAGINALLY QD, Disp: , Rfl:    levocetirizine (XYZAL) 5 MG tablet, TK 1 T PO HS, Disp: , Rfl:    levofloxacin (LEVAQUIN) 750 MG tablet, levofloxacin 750 mg tablet, Disp: , Rfl:    mometasone (NASONEX) 50 MCG/ACT nasal spray, mometasone 50  mcg/actuation nasal spray  SHAKE LQ AND U 1 SPR IEN BID, Disp: , Rfl:    oxybutynin (DITROPAN-XL) 10 MG 24 hr tablet, Take 10 mg by mouth daily. , Disp: , Rfl:    Vitamin D, Ergocalciferol, (DRISDOL) 50000 units CAPS capsule, Take 50,000 Units by mouth every 7 (seven) days. , Disp: , Rfl: 2  Social History   Tobacco Use  Smoking Status Never Smoker  Smokeless Tobacco Never Used    Allergies  Allergen Reactions   Amoxicillin Hives and Itching    Has patient had a PCN reaction causing immediate rash, facial/tongue/throat swelling, SOB or lightheadedness with hypotension: no Has patient had a PCN reaction causing severe rash involving mucus membranes or skin necrosis: no Has patient had a PCN reaction that required hospitalization: was  already in the hospital Has patient had a PCN reaction occurring within the last 10 years: yes   Vesicare [Solifenacin] Itching and Other (See Comments)    vertigo   Wax [Beeswax] Hives and Itching   Morphine And Related Hives   Penicillins Hives    Has patient had a PCN reaction causing immediate rash, facial/tongue/throat swelling, SOB or lightheadedness with hypotension: no Has patient had a PCN reaction causing severe rash involving mucus membranes or skin necrosis: no Has patient had a PCN reaction that required hospitalization: was already in the hospital Has patient had a PCN reaction occurring within the last 10 years: yes If all of the above answers are "NO", then may proceed with Cephalosporin use.    Sulfa Antibiotics Hives   Objective:   There were no vitals filed for this visit. There is no height or weight on file to calculate BMI. Constitutional Well developed. Well nourished.  Vascular Foot warm and well perfused. Capillary refill normal to all digits.   Neurologic Normal speech. Oriented to person, place, and time. Epicritic sensation to light touch grossly present bilaterally.  Dermatologic Skin well-healed  Orthopedic: Toes in good alignment except for second toe appears slightly plantarflexed at the PIPJ.  Fifth toe with pain to palpation   Radiographs: Taken and reviewed bridging across the hallux IPJ noted Assessment:   1. Pain in right foot    Plan:  Patient was evaluated and treated and all questions answered.  S/p foot surgery right -Progressing as expected post-operatively.  Still having pain only at the fifth toe -XR: None -WB Status: transition to normal shoegear. -Sutures: skin well healed -Medications: none refilled -Foot redressed.   -Could consider revision of 2nd toe to screw fixation if it causes her pain in the future. 5th toe pain likely to subside with time.  Return in about 1 month (around 07/15/2019) for Post-op.

## 2019-07-24 NOTE — Progress Notes (Signed)
Patient ID: Patricia Simpson, female   DOB: November 26, 1960, 58 y.o.   MRN: [id number]   Subjective:  Patient presents for followup, states that the right leg is still having some swelling, but otherwise she does not have much pain.  The pain to the right fifth toe has resolved.  She only has pain in the right second toe at present.  Able to tolerate normal shoe gear without much difficulty.  Objective:   Constitutional Well developed. Well nourished.  Vascular Dorsalis pedis pulses palpable bilaterally. Posterior tibial pulses palpable bilaterally. Capillary refill normal to all digits. No cyanosis or clubbing noted. Pedal hair growth normal.  Neurologic Normal speech. Oriented to person, place, and time. Epicritic sensation to light touch grossly present bilaterally.  Dermatologic Skin well-healed.  Orthopedic Toes in good alignment except the second toe which has some PIPJ plantar flexion deformity, third toe rectus. Second and fourth toe slightly edematous. Second toe with pain to palpation.  Fourth toe with only mild pain to palpation.   Radiographs:  Taken and reviewed consistent with postoperative state with successful fusion of the hallux interphalangeal joint.  Apparent fusion of the second toe proximal interphalangeal joint with implant, however, slight plantar flexion to the joint noted.  Successful fusion across the third toe.  Stable arthroplasty of the fourth and fifth toes.  Assessment/Plan:  Patient was evaluated and treated and all questions answered.  1.  Status post right foot surgery. - Overall, the patient is doing okay.  She is very pleased with the results of her surgery.  She has good range of motion of the first metatarsophalangeal joint and good range of motion of the ankle.  She has no pain at the area of her previous bunion.  She only has some pain at the second toe.  We did discuss that should she desire, we could revise the second toe and possibly the fourth toe.   This would include removal of the implant of the second toe and screw fixation.  Preoperatively it was discussed with the patient performing implant versus screw fixation; however, we did opt for the implant due to a more anatomic look to the toe.  I think the toe did heal in a slight plantarflexed position which is causing her pain.  We will follow up in several weeks and the patient to follow up should she have pain.

## 2019-08-13 ENCOUNTER — Other Ambulatory Visit: Payer: Self-pay

## 2019-08-13 ENCOUNTER — Encounter: Payer: Self-pay | Admitting: Cardiology

## 2019-08-13 ENCOUNTER — Ambulatory Visit (INDEPENDENT_AMBULATORY_CARE_PROVIDER_SITE_OTHER): Payer: BC Managed Care – PPO | Admitting: Cardiology

## 2019-08-13 VITALS — BP 112/77 | HR 98 | Ht 64.0 in | Wt 150.8 lb

## 2019-08-13 DIAGNOSIS — I10 Essential (primary) hypertension: Secondary | ICD-10-CM

## 2019-08-13 DIAGNOSIS — I340 Nonrheumatic mitral (valve) insufficiency: Secondary | ICD-10-CM

## 2019-08-13 DIAGNOSIS — R0600 Dyspnea, unspecified: Secondary | ICD-10-CM

## 2019-08-13 DIAGNOSIS — R0609 Other forms of dyspnea: Secondary | ICD-10-CM

## 2019-08-13 DIAGNOSIS — I479 Paroxysmal tachycardia, unspecified: Secondary | ICD-10-CM
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
## 2019-08-24 ENCOUNTER — Other Ambulatory Visit: Payer: Self-pay

## 2019-08-24 ENCOUNTER — Other Ambulatory Visit: Payer: BC Managed Care – PPO

## 2019-08-24 DIAGNOSIS — Z20822 Contact with and (suspected) exposure to covid-19: Secondary | ICD-10-CM

## 2019-08-25 LAB — NOVEL CORONAVIRUS, NAA: SARS-CoV-2, NAA: NOT DETECTED

## 2019-08-30 ENCOUNTER — Ambulatory Visit (INDEPENDENT_AMBULATORY_CARE_PROVIDER_SITE_OTHER): Payer: BC Managed Care – PPO | Admitting: Podiatry

## 2019-08-30 ENCOUNTER — Ambulatory Visit (INDEPENDENT_AMBULATORY_CARE_PROVIDER_SITE_OTHER): Payer: BC Managed Care – PPO

## 2019-08-30 ENCOUNTER — Other Ambulatory Visit: Payer: Self-pay

## 2019-08-30 DIAGNOSIS — M2041 Other hammer toe(s) (acquired), right foot: Secondary | ICD-10-CM

## 2019-08-30 DIAGNOSIS — Z969 Presence of functional implant, unspecified: Secondary | ICD-10-CM

## 2019-08-30 DIAGNOSIS — R600 Localized edema: Secondary | ICD-10-CM | POA: Diagnosis not present

## 2019-08-30 DIAGNOSIS — L910 Hypertrophic scar: Secondary | ICD-10-CM | POA: Diagnosis not present

## 2019-08-30 DIAGNOSIS — R0609 Other forms of dyspnea: Secondary | ICD-10-CM | POA: Diagnosis not present

## 2019-08-30 DIAGNOSIS — I479 Paroxysmal tachycardia, unspecified: Secondary | ICD-10-CM | POA: Diagnosis not present

## 2019-08-30 DIAGNOSIS — M79671 Pain in right foot: Secondary | ICD-10-CM

## 2019-08-30 NOTE — Patient Instructions (Signed)
Pre-Operative Instructions  Congratulations, you have decided to take an important step towards improving your quality of life.  You can be assured that the doctors and staff at [facility] will be with you every step of the way.  Here are some important things you should know:  1. Plan to be at the surgery center/hospital at least 1 (one) hour prior to your scheduled time, unless otherwise directed by the surgical center/hospital staff.  You must have a responsible adult accompany you, remain during the surgery and drive you home.  Make sure you have directions to the surgical center/hospital to ensure you arrive on time. 2. If you are having surgery at [facility] or [facility] hospitals, you will need a copy of your medical history and physical form from your family physician within one month prior to the date of surgery. We will give you a form for your primary physician to complete.  3. We make every effort to accommodate the date you request for surgery.  However, there are times where surgery dates or times have to be moved.  We will contact you as soon as possible if a change in schedule is required.   4. No aspirin/ibuprofen for one week before surgery.  If you are on aspirin, any non-steroidal anti-inflammatory medications (Mobic, Aleve, Ibuprofen) should not be taken seven (7) days prior to your surgery.  You make take Tylenol for pain prior to surgery.  5. Medications - If you are taking daily heart and blood pressure medications, seizure, reflux, allergy, asthma, anxiety, pain or diabetes medications, make sure you notify the surgery center/hospital before the day of surgery so they can tell you which medications you should take or avoid the day of surgery. 6. No food or drink after midnight the night before surgery unless directed otherwise by surgical center/hospital staff. 7. No alcoholic beverages 24-hours prior to surgery.  No smoking 24-hours prior or 24-hours after  surgery. 8. Wear loose pants or shorts. They should be loose enough to fit over bandages, boots, and casts. 9. Don't wear slip-on shoes. Sneakers are preferred. 10. Bring your boot with you to the surgery center/hospital.  Also bring crutches or a walker if your physician has prescribed it for you.  If you do not have this equipment, it will be provided for you after surgery. 11. If you have not been contacted by the surgery center/hospital by the day before your surgery, call to confirm the date and time of your surgery. 12. Leave-time from work may vary depending on the type of surgery you have.  Appropriate arrangements should be made prior to surgery with your employer. 13. Prescriptions will be provided immediately following surgery by your doctor.  Fill these as soon as possible after surgery and take the medication as directed. Pain medications will not be refilled on weekends and must be approved by the doctor. 14. Remove nail polish on the operative foot and avoid getting pedicures prior to surgery. 15. Wash the night before surgery.  The night before surgery wash the foot and leg well with water and the antibacterial soap provided. Be sure to pay special attention to beneath the toenails and in between the toes.  Wash for at least three (3) minutes. Rinse thoroughly with water and dry well with a towel.  Perform this wash unless told not to do so by your physician.  Enclosed: 1 Ice pack (please put in freezer the night before surgery)   1 Hibiclens skin cleaner     Pre-op instructions  If you have any questions regarding the instructions, please do not hesitate to call our office.  [place]: [address], [place], [place] [address] -- [phone]  [place]: [address] [place], [place] [address] -- [phone]  [place]: [address]  [place], [place] [address] -- [phone]   Website: [email or web address] 

## 2019-08-30 NOTE — Progress Notes (Signed)
Patient ID: Patricia Simpson, female   DOB: 21-Jul-1961, 58 y.o.   MRN: [id number]   Subjective:  Patient presents for followup, still not happy with the second and third toes.  States that second toes still edematous and bottom of her toes to straighten causing her issues.  The bunion incision is causing her pain and not straight and thickened.  Objective:   Constitutional Well developed. Well nourished.  Vascular Dorsalis pedis pulses palpable bilaterally. Posterior tibial pulses palpable bilaterally. Capillary refill normal to all digits. No cyanosis or clubbing noted. Pedal hair growth normal.  Neurologic Normal speech. Oriented to person, place, and time. Epicritic sensation to light touch grossly present bilaterally.  Dermatologic Skin well-healed.  Slight hypertrophic scar over the first metatarsal  Orthopedic Toes in good alignment except the second toe which has some PIPJ plantar flexion deformity, third toe rectus. Second and fourth toe slightly edematous. Second toe with pain to palpation.  Fourth toe with only mild pain to palpation.   Prior Radiographs:  Taken and reviewed consistent with postoperative state with successful fusion of the hallux interphalangeal joint.  Apparent fusion of the second toe proximal interphalangeal joint with implant, however, slight plantar flexion to the joint noted.  Successful fusion across the third toe.  Stable arthroplasty of the fourth and fifth toes.  Assessment/Plan:  Patient was evaluated and treated and all questions answered.  1.  Status post right foot surgery. -Patient is still having some pain especially at the second toe.  She is unhappy with appearance of the third toe.  We did discuss surgical revision.  She does not like the appearance of the scar over the first toe as it is slightly bent and slightly hypertrophic.  States the third toes to straight on the second toe is too fat.  I do think that the edema will reduce with removing  the implant.  We can remove the screw in the third toe and perform an arthroplasty.  All risk benefits terms were discussed -Patient has failed all conservative therapy and wishes to proceed with surgical intervention. All risks, benefits, and alternatives discussed with patient. No guarantees given. Consent reviewed and signed by patient. -Planned procedures: Revision of scar right first toe and metatarsal, removal of hardware, revision of hammertoes second third toes  30 minutes of face to face time were spent with the patient. >50% of this was spent on counseling and coordination of care. Specifically discussed with patient the above diagnoses and overall treatment plan.

## 2019-09-03 ENCOUNTER — Other Ambulatory Visit: Payer: Self-pay | Admitting: *Deleted

## 2019-09-03 DIAGNOSIS — Z01812 Encounter for preprocedural laboratory examination: Secondary | ICD-10-CM

## 2019-09-04 ENCOUNTER — Other Ambulatory Visit: Payer: Self-pay

## 2019-09-04 DIAGNOSIS — Z20822 Contact with and (suspected) exposure to covid-19: Secondary | ICD-10-CM

## 2019-09-06 LAB — NOVEL CORONAVIRUS, NAA: SARS-CoV-2, NAA: NOT DETECTED

## 2019-09-07 ENCOUNTER — Other Ambulatory Visit: Payer: Self-pay

## 2019-09-07 ENCOUNTER — Other Ambulatory Visit: Payer: Self-pay | Admitting: Podiatry

## 2019-09-07 ENCOUNTER — Ambulatory Visit (INDEPENDENT_AMBULATORY_CARE_PROVIDER_SITE_OTHER): Payer: BC Managed Care – PPO

## 2019-09-07 ENCOUNTER — Ambulatory Visit (INDEPENDENT_AMBULATORY_CARE_PROVIDER_SITE_OTHER): Payer: BC Managed Care – PPO | Admitting: Podiatry

## 2019-09-07 DIAGNOSIS — M25572 Pain in left ankle and joints of left foot: Secondary | ICD-10-CM

## 2019-09-07 DIAGNOSIS — S9002XA Contusion of left ankle, initial encounter: Secondary | ICD-10-CM

## 2019-09-11 ENCOUNTER — Ambulatory Visit: Payer: BC Managed Care – PPO | Admitting: Cardiology

## 2019-09-14 ENCOUNTER — Encounter: Payer: Self-pay | Admitting: Podiatry

## 2019-09-14 NOTE — Progress Notes (Signed)
Subjective:  Patient ID: Patricia Simpson, female    DOB: [birth year],  MRN: [id number]  Chief Complaint  Patient presents with  . Foot Pain    pt is here for left ankle swelling which happened a couple of days ago, pt states that she fell off the back of her chair    58 y.o. female presents with the above complaint.  Patient presents with left ankle swelling secondary to status post a fall.  She states that it started hurting right afterwards.  She wanted to make sure that there is nothing broken in the ankle.  She is well-known to Dr. March Rummage who has performed multiple surgeries on her foot.  She denies any other acute complaints.  She states that she has been weightbearing as tolerated with her shoes.  She also has a feeling of ankle giving out when she is ambulating.   Review of Systems: Negative except as noted in the HPI. Denies N/V/F/Ch.  Past Medical History:  Diagnosis Date  . Acquired deformity of right foot    s/p  hammertoe correction 06-02-2018  . Anxiety    "nervous episodes",   . Asthma    no inhaler  . Complication of anesthesia    2004- had hives post anesth. , ?relative to anesth. or Morphine  . Costochondritis 03/2019   pt had chest pain and DOE,  had cardiology consult dx costochondritis  . DDD (degenerative disc disease), cervical   . Dental bridge present    upper  . Essential tremor    bilateral hands ,  pt states residual from [place]  . GERD (gastroesophageal reflux disease)    occasional , no meds  . Headache(784.0)    migraines - unknown triggers  . Heart murmur   . History of hepatitis C followed by [facility] in [place] (notes in epic under media)   dx 2002 (per pt from blood transfusion's received 1986) ;  treated w/ medication and cured 2016  . Hypertension   . Left arm weakness    w/ numbness/ tingleing , per pt residual from [place]  . MVP (mitral valve prolapse)    cardiologist-- dr Einar Gip--- dx via echo 03-27-2019  mild MVP with  moderate MR  . Raynaud's disease   . Seasonal allergies   . Wears glasses     Current Outpatient Medications:  .  amLODipine (NORVASC) 10 MG tablet, Take 1 tablet (10 mg total) by mouth daily. (Patient taking differently: Take 10 mg by mouth daily. ), Disp: 90 tablet, Rfl: 3 .  Azilsartan Medoxomil (EDARBI) 40 MG TABS, Take 40 mg by mouth daily. , Disp: , Rfl:  .  cetirizine (ZYRTEC) 10 MG tablet, Take 10 mg by mouth as needed. , Disp: , Rfl: 0 .  DULoxetine (CYMBALTA) 60 MG capsule, Take 60 mg by mouth 2 (two) times daily., Disp: , Rfl:  .  estradiol (ESTRACE) 2 MG tablet, Take 2 mg by mouth daily., Disp: , Rfl: 1 .  ibuprofen (ADVIL) 800 MG tablet, every 6 (six) hours as needed. , Disp: , Rfl:  .  INTRAROSA 6.5 MG INST, PLACE 1 INSERT VAGINALLY QD, Disp: , Rfl:  .  levocetirizine (XYZAL) 5 MG tablet, as needed. , Disp: , Rfl:  .  mometasone (NASONEX) 50 MCG/ACT nasal spray, mometasone 50 mcg/actuation nasal spray  SHAKE LQ AND U 1 SPR IEN BID, Disp: , Rfl:  .  oxybutynin (DITROPAN-XL) 10 MG 24 hr tablet, Take 10 mg by mouth  daily. , Disp: , Rfl:  .  temazepam (RESTORIL) 15 MG capsule, as needed. , Disp: , Rfl:   Social History   Tobacco Use  Smoking Status Never Smoker  Smokeless Tobacco Never Used    Allergies  Allergen Reactions  . Amoxicillin Hives and Itching    Has patient had a PCN reaction causing immediate rash, facial/tongue/throat swelling, SOB or lightheadedness with hypotension: no Has patient had a PCN reaction causing severe rash involving mucus membranes or skin necrosis: no Has patient had a PCN reaction that required hospitalization: was already in the hospital Has patient had a PCN reaction occurring within the last 10 years: yes  . Vesicare [Solifenacin] Itching and Other (See Comments)    vertigo  . Wax [Beeswax] Hives and Itching  . Morphine And Related Hives  . Penicillins Hives    Has patient had a PCN reaction causing immediate rash,  facial/tongue/throat swelling, SOB or lightheadedness with hypotension: no Has patient had a PCN reaction causing severe rash involving mucus membranes or skin necrosis: no Has patient had a PCN reaction that required hospitalization: was already in the hospital Has patient had a PCN reaction occurring within the last 10 years: yes If all of the above answers are "NO", then may proceed with Cephalosporin use.   . Sulfa Antibiotics Hives   Objective:  There were no vitals filed for this visit. There is no height or weight on file to calculate BMI. Constitutional Well developed. Well nourished.  Vascular Dorsalis pedis pulses palpable bilaterally. Posterior tibial pulses palpable bilaterally. Capillary refill normal to all digits.  No cyanosis or clubbing noted. Pedal hair growth normal.  Neurologic Normal speech. Oriented to person, place, and time. Epicritic sensation to light touch grossly present bilaterally.  Dermatologic Nails well groomed and normal in appearance. No open wounds. No skin lesions.  Orthopedic:  Pain on palpation to the medial lateral ankle across the ankle joint.  Pain with ankle range of motion dorsiflexion plantarflexion active and passive.  No pain at the Achilles the ATFL PT and posterior tibial and peroneal tendon.   Radiographs: 3 ankle views of skeletally mature adult: There is a decrease in joint space across the ankle joint evenly.  There is subchondral sclerosis present at the level of the tibia and the talus consistent with mild arthritic changes.  Some dorsal arthritis of the midfoot noted.  No other bony deformities noted. Assessment:   1. Left ankle pain, unspecified chronicity    Plan:  Patient was evaluated and treated and all questions answered.  Left ankle mild sprain secondary to fall -Given the patient's pain is mild, I believe patient will benefit from just ankle brace.  Believe this will give her some stability to the instability that she  is feeling when she is ambulating.  This will also allow her to help heal the soft tissue elements.    No follow-ups on file.

## 2019-09-28 ENCOUNTER — Ambulatory Visit (INDEPENDENT_AMBULATORY_CARE_PROVIDER_SITE_OTHER): Payer: BC Managed Care – PPO | Admitting: Podiatry

## 2019-09-28 DIAGNOSIS — Z5329 Procedure and treatment not carried out because of patient's decision for other reasons: Secondary | ICD-10-CM

## 2019-09-28 NOTE — Progress Notes (Signed)
No show for appt. 

## 2019-10-02 ENCOUNTER — Telehealth: Payer: Self-pay | Admitting: *Deleted

## 2019-10-02 NOTE — Telephone Encounter (Signed)
DOS 10/10/2019 REMOVAL OF HARDWARE 1, 2 - 20680, EXC. BENIGN LESION 3.1 - 4.0 CM - 11424, AND HAMMER TOE REPAIR 2,3 [id number] OF THE RIGHT FOOT  BCBS: Eligibility Date - 10/25/2018 - 10/24/9998   In-Network    Max Per Benefit Period Year-to-Date Remaining  CoInsurance  20%    Deductible  $1250.00 $0.00  Out-Of-Pocket  $4890.00 $0.00   AMBULATORY SURGERY  In Network  [employer] Coinsurance  Not Applicable  [id number] per [place]

## 2019-10-05 ENCOUNTER — Telehealth: Payer: Self-pay | Admitting: *Deleted

## 2019-10-05 NOTE — Telephone Encounter (Signed)
"  I'm a patient of Dr. March Rummage.  We were going to do another do over on my foot for December 16 but due to the fall that I had on my other foot, I'm going to have to reschedule that.  So, if you could take me off of the schedule for December 16, I'd appreciate it.  If you have any questions, reach me at [phone]."

## 2019-10-05 NOTE — Progress Notes (Signed)
Received call back from pt , message was left to make covid appt/ pre-op interview.  Pt stated that she is cancelling her surgery to injuring her other foot and that she already called dr price office and spoke w/ delydia.  Stated will reschedule later.

## 2019-10-05 NOTE — Telephone Encounter (Signed)
I called Patricia Simpson at [facility] scheduling and canceled Patricia Simpson's surgery scheduled for 10/10/2019 at [facility].  I left Patricia Simpson a message that I had canceled her appointment scheduled for 10/10/2019.  I asked her to give me a call back when she was ready to reschedule her appointment.

## 2019-10-10 ENCOUNTER — Encounter (HOSPITAL_BASED_OUTPATIENT_CLINIC_OR_DEPARTMENT_OTHER): Payer: Self-pay

## 2019-10-10 ENCOUNTER — Ambulatory Visit (HOSPITAL_BASED_OUTPATIENT_CLINIC_OR_DEPARTMENT_OTHER): Admit: 2019-10-10 | Payer: BC Managed Care – PPO | Admitting: Podiatry

## 2019-10-10 SURGERY — REMOVAL, HARDWARE
Anesthesia: General | Site: Toe | Laterality: Right

## 2019-10-30 ENCOUNTER — Ambulatory Visit: Payer: BC Managed Care – PPO | Attending: Internal Medicine

## 2019-10-30 DIAGNOSIS — Z20822 Contact with and (suspected) exposure to covid-19: Secondary | ICD-10-CM

## 2019-11-01 LAB — NOVEL CORONAVIRUS, NAA: SARS-CoV-2, NAA: NOT DETECTED

## 2020-01-08 NOTE — Progress Notes (Signed)
Subjective:  Patient ID: Patricia Simpson, female    DOB: 11/07/1960,  MRN: [id number]  Chief Complaint  Patient presents with  . Routine Post Op     DOS 04/20/2019 REMOVAL HARDWARE, HAMMER TOE REPAIR 2-5, HALLUX IPJ FUSION, GASTROCNEMIUS RECESS, AND CAPSULOTOMY 1ST RT " my foot feels good, I can move my toes and ankle without any pain"     DOS: 04/20/2019 Procedure: Capsulotomy right first MPJ with removal of arthrofibrosis, hardware removal first metatarsal, hallux interphalangeal fusion, correction of hammertoes 234 and 5, gastrocnemius recession with manipulation of ankle under anesthesia  59 y.o. female returns for post-op check.  Hx as above.  Review of Systems: Negative except as noted in the HPI. Denies N/V/F/Ch.  Past Medical History:  Diagnosis Date  . Acquired deformity of right foot    s/p  hammertoe correction 06-02-2018  . Anxiety    "nervous episodes",   . Asthma    no inhaler  . Complication of anesthesia    2004- had hives post anesth. , ?relative to anesth. or Morphine  . Costochondritis 03/2019   pt had chest pain and DOE,  had cardiology consult dx costochondritis  . DDD (degenerative disc disease), cervical   . Dental bridge present    upper  . Essential tremor    bilateral hands ,  pt states residual from [place]  . GERD (gastroesophageal reflux disease)    occasional , no meds  . Headache(784.0)    migraines - unknown triggers  . Heart murmur   . History of hepatitis C followed by [facility] in [place] (notes in epic under media)   dx 2002 (per pt from blood transfusion's received 1986) ;  treated w/ medication and cured 2016  . Hypertension   . Left arm weakness    w/ numbness/ tingleing , per pt residual from [place]  . MVP (mitral valve prolapse)    cardiologist-- dr Einar Gip--- dx via echo 03-27-2019  mild MVP with moderate MR  . Raynaud's disease   . Seasonal allergies   . Wears glasses     Current Outpatient Medications:  .   amLODipine (NORVASC) 10 MG tablet, Take 1 tablet (10 mg total) by mouth daily. (Patient taking differently: Take 10 mg by mouth daily. ), Disp: 90 tablet, Rfl: 3 .  Azilsartan Medoxomil (EDARBI) 40 MG TABS, Take 40 mg by mouth daily. , Disp: , Rfl:  .  cetirizine (ZYRTEC) 10 MG tablet, Take 10 mg by mouth as needed. , Disp: , Rfl: 0 .  DULoxetine (CYMBALTA) 60 MG capsule, Take 60 mg by mouth 2 (two) times daily., Disp: , Rfl:  .  estradiol (ESTRACE) 2 MG tablet, Take 2 mg by mouth daily., Disp: , Rfl: 1 .  ibuprofen (ADVIL) 800 MG tablet, every 6 (six) hours as needed. , Disp: , Rfl:  .  INTRAROSA 6.5 MG INST, PLACE 1 INSERT VAGINALLY QD, Disp: , Rfl:  .  levocetirizine (XYZAL) 5 MG tablet, as needed. , Disp: , Rfl:  .  mometasone (NASONEX) 50 MCG/ACT nasal spray, mometasone 50 mcg/actuation nasal spray  SHAKE LQ AND U 1 SPR IEN BID, Disp: , Rfl:  .  oxybutynin (DITROPAN-XL) 10 MG 24 hr tablet, Take 10 mg by mouth daily. , Disp: , Rfl:  .  temazepam (RESTORIL) 15 MG capsule, as needed. , Disp: , Rfl:   Social History   Tobacco Use  Smoking Status Never Smoker  Smokeless Tobacco Never Used  Allergies  Allergen Reactions  . Amoxicillin Hives and Itching    Has patient had a PCN reaction causing immediate rash, facial/tongue/throat swelling, SOB or lightheadedness with hypotension: no Has patient had a PCN reaction causing severe rash involving mucus membranes or skin necrosis: no Has patient had a PCN reaction that required hospitalization: was already in the hospital Has patient had a PCN reaction occurring within the last 10 years: yes  . Vesicare [Solifenacin] Itching and Other (See Comments)    vertigo  . Wax [Beeswax] Hives and Itching  . Morphine And Related Hives  . Penicillins Hives    Has patient had a PCN reaction causing immediate rash, facial/tongue/throat swelling, SOB or lightheadedness with hypotension: no Has patient had a PCN reaction causing severe rash involving  mucus membranes or skin necrosis: no Has patient had a PCN reaction that required hospitalization: was already in the hospital Has patient had a PCN reaction occurring within the last 10 years: yes If all of the above answers are "NO", then may proceed with Cephalosporin use.   . Sulfa Antibiotics Hives   Objective:   There were no vitals filed for this visit. There is no height or weight on file to calculate BMI. Constitutional Well developed. Well nourished.  Vascular Foot warm and well perfused. Capillary refill normal to all digits.   Neurologic Normal speech. Oriented to person, place, and time. Epicritic sensation to light touch grossly present bilaterally.  Dermatologic Skin healing well without signs of infection. Skin edges well coapted without signs of infection.  Orthopedic: Tenderness to palpation noted about the surgical site.  Toes in good alignment second toe appears slightly plantarflexed at the PIPJ   Assessment:   1. Post-operative state    Plan:  Patient was evaluated and treated and all questions answered.  S/p foot surgery right -Progressing as expected post-operatively.   -WB Status: WBAT in walking boot -Sutures: Intact. -Medications: none refilled -Foot redressed.  Toes individually splinted in straight position with Betadine splint  No follow-ups on file.

## 2020-03-25 ENCOUNTER — Other Ambulatory Visit: Payer: Self-pay

## 2020-03-25 ENCOUNTER — Ambulatory Visit: Payer: BC Managed Care – PPO

## 2020-03-25 DIAGNOSIS — I340 Nonrheumatic mitral (valve) insufficiency: Secondary | ICD-10-CM

## 2020-03-25 DIAGNOSIS — I341 Nonrheumatic mitral (valve) prolapse: Secondary | ICD-10-CM
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
## 2020-04-03 ENCOUNTER — Ambulatory Visit: Payer: BC Managed Care – PPO | Admitting: Cardiology

## 2020-04-03 ENCOUNTER — Other Ambulatory Visit: Payer: Self-pay

## 2020-04-03 ENCOUNTER — Encounter: Payer: Self-pay | Admitting: Cardiology

## 2020-04-03 VITALS — BP 137/80 | HR 90 | Ht 64.0 in | Wt 153.9 lb

## 2020-04-03 DIAGNOSIS — R600 Localized edema: Secondary | ICD-10-CM

## 2020-04-03 DIAGNOSIS — R0609 Other forms of dyspnea: Secondary | ICD-10-CM

## 2020-04-03 DIAGNOSIS — I10 Essential (primary) hypertension: Secondary | ICD-10-CM

## 2020-04-03 DIAGNOSIS — I341 Nonrheumatic mitral (valve) prolapse: Secondary | ICD-10-CM

## 2020-04-03 MED ORDER — TRIAMTERENE-HCTZ 37.5-25 MG PO TABS: 1.0000 | ORAL_TABLET | ORAL | 2 refills | Status: DC

## 2020-04-10 ENCOUNTER — Other Ambulatory Visit: Payer: Self-pay | Admitting: Cardiology

## 2020-04-10 DIAGNOSIS — I10 Essential (primary) hypertension: Secondary | ICD-10-CM

## 2020-04-11 ENCOUNTER — Telehealth: Payer: Self-pay

## 2020-04-11 NOTE — Telephone Encounter (Signed)
Pt called regarding PCV on Lower Venous US on L left. Patient states she is having "terrible cramps" at night on R leg too and would like to know if the [place] can be done on both legs. Please advise. Thanks!

## 2020-04-16 ENCOUNTER — Other Ambulatory Visit: Payer: Self-pay

## 2020-04-17 ENCOUNTER — Telehealth: Payer: Self-pay

## 2020-04-17 NOTE — Telephone Encounter (Signed)
Received a message that patient was requesting a call back from me in regards to [place] appt. Left vm to cb.

## 2020-04-17 NOTE — Telephone Encounter (Signed)
Spoke with patient in regards to [place]. Patient was concerned because she never received a cb about the details of this procedure (6/18), so she canceled it. Dr. Einar Gip responded responded on 6/19 via MyChart to further explain details. Patient states she did not see the message and will send a message via MyChart to further explain procedure details.

## 2020-04-25 ENCOUNTER — Ambulatory Visit: Payer: BC Managed Care – PPO

## 2020-04-25 ENCOUNTER — Other Ambulatory Visit: Payer: Self-pay

## 2020-04-25 DIAGNOSIS — R600 Localized edema: Secondary | ICD-10-CM

## 2020-05-12 NOTE — Telephone Encounter (Signed)
From pt

## 2020-05-15 ENCOUNTER — Ambulatory Visit: Payer: Self-pay | Admitting: Cardiology

## 2020-09-22 ENCOUNTER — Other Ambulatory Visit: Payer: BC Managed Care – PPO

## 2020-10-14 ENCOUNTER — Emergency Department (HOSPITAL_COMMUNITY): Payer: BC Managed Care – PPO

## 2020-10-14 ENCOUNTER — Other Ambulatory Visit: Payer: Self-pay

## 2020-10-14 ENCOUNTER — Emergency Department (HOSPITAL_COMMUNITY)
Admission: EM | Admit: 2020-10-14 | Discharge: 2020-10-15 | Disposition: A | Discharge status: Home / Self Care | Payer: BC Managed Care – PPO | Attending: Emergency Medicine | Admitting: Emergency Medicine

## 2020-10-14 DIAGNOSIS — Z7951 Long term (current) use of inhaled steroids: Secondary | ICD-10-CM | POA: Diagnosis not present

## 2020-10-14 DIAGNOSIS — M25512 Pain in left shoulder: Secondary | ICD-10-CM | POA: Diagnosis not present

## 2020-10-14 DIAGNOSIS — I10 Essential (primary) hypertension: Secondary | ICD-10-CM | POA: Diagnosis not present

## 2020-10-14 DIAGNOSIS — Z79899 Other long term (current) drug therapy: Secondary | ICD-10-CM | POA: Insufficient documentation

## 2020-10-14 DIAGNOSIS — M79642 Pain in left hand: Secondary | ICD-10-CM | POA: Insufficient documentation

## 2020-10-14 DIAGNOSIS — M542 Cervicalgia: Secondary | ICD-10-CM | POA: Diagnosis present

## 2020-10-14 DIAGNOSIS — J441 Chronic obstructive pulmonary disease with (acute) exacerbation: Secondary | ICD-10-CM | POA: Insufficient documentation

## 2020-10-14 DIAGNOSIS — Y92410 Unspecified street and highway as the place of occurrence of the external cause: Secondary | ICD-10-CM | POA: Insufficient documentation

## 2020-10-14 DIAGNOSIS — M546 Pain in thoracic spine: Secondary | ICD-10-CM | POA: Insufficient documentation

## 2020-10-14 MED ORDER — OXYCODONE-ACETAMINOPHEN 5-325 MG PO TABS
1.0000 | ORAL_TABLET | Freq: Once | ORAL | Status: AC
  Administered 2020-10-14: 1 via ORAL
  Filled 2020-10-14: qty 1

## 2020-10-14 MED ORDER — ONDANSETRON 4 MG PO TBDP
4.0000 mg | ORAL_TABLET | Freq: Once | ORAL | Status: DC
  Filled 2020-10-14: qty 1
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
## 2020-10-15 NOTE — Discharge Instructions (Signed)
Follow-up with your primary doctor.  Return to ER for reassessment if you develop chest pain difficulty breathing or other new concerning symptoms

## 2021-05-23 ENCOUNTER — Emergency Department (HOSPITAL_COMMUNITY): Payer: BC Managed Care – PPO

## 2021-05-23 ENCOUNTER — Other Ambulatory Visit: Payer: Self-pay

## 2021-05-23 ENCOUNTER — Ambulatory Visit (HOSPITAL_COMMUNITY)
Admission: EM | Admit: 2021-05-23 | Discharge: 2021-05-23 | Disposition: A | Discharge status: Home / Self Care | Payer: BC Managed Care – PPO

## 2021-05-23 ENCOUNTER — Encounter (HOSPITAL_COMMUNITY): Payer: Self-pay | Admitting: Emergency Medicine

## 2021-05-23 ENCOUNTER — Emergency Department (HOSPITAL_COMMUNITY)
Admission: EM | Admit: 2021-05-23 | Discharge: 2021-05-23 | Disposition: A | Discharge status: Home / Self Care | Payer: BC Managed Care – PPO | Attending: Emergency Medicine | Admitting: Emergency Medicine

## 2021-05-23 DIAGNOSIS — S32009A Unspecified fracture of unspecified lumbar vertebra, initial encounter for closed fracture: Secondary | ICD-10-CM | POA: Diagnosis not present

## 2021-05-23 DIAGNOSIS — Z9889 Other specified postprocedural states: Secondary | ICD-10-CM | POA: Diagnosis not present

## 2021-05-23 DIAGNOSIS — W19XXXA Unspecified fall, initial encounter: Secondary | ICD-10-CM | POA: Diagnosis not present

## 2021-05-23 DIAGNOSIS — M25561 Pain in right knee: Secondary | ICD-10-CM | POA: Diagnosis not present

## 2021-05-23 DIAGNOSIS — J441 Chronic obstructive pulmonary disease with (acute) exacerbation: Secondary | ICD-10-CM | POA: Insufficient documentation

## 2021-05-23 DIAGNOSIS — Z981 Arthrodesis status: Secondary | ICD-10-CM

## 2021-05-23 DIAGNOSIS — M25551 Pain in right hip: Secondary | ICD-10-CM | POA: Diagnosis not present

## 2021-05-23 DIAGNOSIS — I10 Essential (primary) hypertension: Secondary | ICD-10-CM | POA: Diagnosis not present

## 2021-05-23 DIAGNOSIS — M25512 Pain in left shoulder: Secondary | ICD-10-CM | POA: Insufficient documentation

## 2021-05-23 DIAGNOSIS — R519 Headache, unspecified: Secondary | ICD-10-CM

## 2021-05-23 DIAGNOSIS — J45909 Unspecified asthma, uncomplicated: Secondary | ICD-10-CM | POA: Diagnosis not present

## 2021-05-23 DIAGNOSIS — Z7951 Long term (current) use of inhaled steroids: Secondary | ICD-10-CM | POA: Diagnosis not present

## 2021-05-23 DIAGNOSIS — Z79899 Other long term (current) drug therapy: Secondary | ICD-10-CM | POA: Diagnosis not present

## 2021-05-23 DIAGNOSIS — R600 Localized edema: Secondary | ICD-10-CM | POA: Diagnosis not present

## 2021-05-23 DIAGNOSIS — W01198A Fall on same level from slipping, tripping and stumbling with subsequent striking against other object, initial encounter: Secondary | ICD-10-CM | POA: Insufficient documentation

## 2021-05-23 DIAGNOSIS — Y92002 Bathroom of unspecified non-institutional (private) residence single-family (private) house as the place of occurrence of the external cause: Secondary | ICD-10-CM | POA: Insufficient documentation

## 2021-05-23 DIAGNOSIS — M542 Cervicalgia: Secondary | ICD-10-CM | POA: Diagnosis not present

## 2021-05-23 MED ORDER — AMLODIPINE BESYLATE 10 MG PO TABS: 10.0000 mg | ORAL_TABLET | Freq: Every evening | ORAL | 0 refills | Status: DC

## 2021-05-23 MED ORDER — TRIAMTERENE-HCTZ 37.5-25 MG PO TABS
1.0000 | ORAL_TABLET | ORAL | 0 refills | Status: DC
Start: 2021-05-23 — End: 2021-08-24

## 2021-05-23 MED ORDER — OXYCODONE-ACETAMINOPHEN 5-325 MG PO TABS: 1.0000 | ORAL_TABLET | Freq: Four times a day (QID) | ORAL | 0 refills | Status: DC | PRN

## 2021-05-23 MED ORDER — ACETAMINOPHEN 500 MG PO TABS
1000.0000 mg | ORAL_TABLET | Freq: Once | ORAL | Status: AC
  Administered 2021-05-23: 1000 mg via ORAL
  Filled 2021-05-23: qty 2

## 2021-05-23 MED ORDER — METHOCARBAMOL 500 MG PO TABS: 500.0000 mg | ORAL_TABLET | Freq: Two times a day (BID) | ORAL | 0 refills | Status: DC

## 2021-05-23 MED ORDER — ONDANSETRON 4 MG PO TBDP: 4.0000 mg | ORAL_TABLET | Freq: Three times a day (TID) | ORAL | 0 refills | Status: DC | PRN
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
## 2021-05-23 NOTE — ED Notes (Signed)
Ambulated to room from lobby without assistance.
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
## 2021-05-23 NOTE — Discharge Instructions (Addendum)
As we discussed, your imaging today was reassuring.  Your imaging of your lower back showed an L1 transverse process fracture on the right.  Your thoracic spine (your mid back) did not show any acute fracture.  He did have some mild disc protrusion that may be from your previous area.  Please follow-up with your neurosurgeon (Dr. Arnoldo Morale).  You can take Tylenol or Ibuprofen as directed for pain. You can alternate Tylenol and Ibuprofen every 4 hours. If you take Tylenol at 1pm, then you can take Ibuprofen at 5pm. Then you can take Tylenol again at 9pm.   Take pain medications as directed for break through pain. Do not drive or operate machinery while taking this medication.   Take Zofran as needed for nausea/vomiting.  You can take Robaxin as needed.  Do not take it at the same time as some pain medication. Take Robaxin as prescribed. This medication will make you drowsy so do not drive or drink alcohol when taking it.  Return to the Emergency Department immediately for any worsening back pain, neck pain, difficulty walking, numbness/weaknss of your arms or legs, urinary or bowel accidents, fever or any other worsening or concerning symptoms.

## 2021-05-23 NOTE — ED Triage Notes (Signed)
Pt reports she fell in her bathroom last night . Pt has back pain and Rt knee pain.Pt reports having Rod in her neck and feels like area is swollen.

## 2021-05-23 NOTE — ED Triage Notes (Signed)
Reports falling yesterday and hitting her back on the bathtub, unsure why she fell, sent by [facility]. Ankle and back pain.

## 2021-05-23 NOTE — Discharge Instructions (Addendum)
-  Please head to the ED for evaluation of severe headache and multiple musculoskeletal injuries following fall.  I am concerned that you need a CT of the head, as well as an MRI of the spine given your current symptoms and your history of spinal surgery. If symptoms get worse on the way like dizziness, vision changes, weakness-stop and call 911 immediately.

## 2021-08-20 ENCOUNTER — Telehealth: Payer: Self-pay

## 2021-08-20 NOTE — Telephone Encounter (Signed)
Please do set up appointment

## 2021-08-24 ENCOUNTER — Encounter: Payer: Self-pay | Admitting: Student

## 2021-08-24 ENCOUNTER — Other Ambulatory Visit: Payer: Self-pay

## 2021-08-24 ENCOUNTER — Ambulatory Visit: Payer: BC Managed Care – PPO | Admitting: Student

## 2021-08-24 VITALS — BP 154/85 | HR 81 | Temp 98.0°F | Resp 17 | Ht 64.0 in | Wt 144.0 lb

## 2021-08-24 DIAGNOSIS — R0609 Other forms of dyspnea: Secondary | ICD-10-CM

## 2021-08-24 DIAGNOSIS — I341 Nonrheumatic mitral (valve) prolapse: Secondary | ICD-10-CM

## 2021-08-24 DIAGNOSIS — R072 Precordial pain: Secondary | ICD-10-CM

## 2021-08-24 MED ORDER — ALBUTEROL SULFATE HFA 108 (90 BASE) MCG/ACT IN AERS
2.0000 | INHALATION_SPRAY | Freq: Four times a day (QID) | RESPIRATORY_TRACT | 0 refills | Status: AC | PRN
Start: 2021-08-24 — End: ?
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
## 2021-09-01 ENCOUNTER — Other Ambulatory Visit: Payer: BC Managed Care – PPO

## 2021-09-21 ENCOUNTER — Other Ambulatory Visit: Payer: BC Managed Care – PPO

## 2021-09-24 ENCOUNTER — Ambulatory Visit: Payer: BC Managed Care – PPO

## 2021-09-24 ENCOUNTER — Other Ambulatory Visit: Payer: Self-pay

## 2021-09-24 DIAGNOSIS — I341 Nonrheumatic mitral (valve) prolapse: Secondary | ICD-10-CM

## 2021-09-24 DIAGNOSIS — R0609 Other forms of dyspnea: Secondary | ICD-10-CM

## 2021-10-09 ENCOUNTER — Other Ambulatory Visit: Payer: Self-pay | Admitting: Student

## 2021-10-09 DIAGNOSIS — I2510 Atherosclerotic heart disease of native coronary artery without angina pectoris: Secondary | ICD-10-CM

## 2021-10-13 ENCOUNTER — Inpatient Hospital Stay: Admission: RE | Admit: 2021-10-13 | Payer: BC Managed Care – PPO | Source: Ambulatory Visit

## 2021-10-20 ENCOUNTER — Ambulatory Visit: Payer: BC Managed Care – PPO | Admitting: Student

## 2021-11-10 ENCOUNTER — Telehealth: Payer: Self-pay | Admitting: Student

## 2021-11-10 DIAGNOSIS — R072 Precordial pain: Secondary | ICD-10-CM

## 2021-11-10 NOTE — Telephone Encounter (Signed)
Patient says she called [facility] to schedule her cardiac calcium score test, but she was informed they could not see an order in the system for her to have that done. Can someone put in the order? She said someone needs to fax it over to them.

## 2021-11-10 NOTE — Telephone Encounter (Signed)
Order has been placed. Please fax accordingly.
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
## 2021-11-18 ENCOUNTER — Ambulatory Visit: Payer: BC Managed Care – PPO | Admitting: Student

## 2021-11-23 ENCOUNTER — Ambulatory Visit
Admission: RE | Admit: 2021-11-23 | Discharge: 2021-11-23 | Disposition: A | Discharge status: Home / Self Care | Payer: No Typology Code available for payment source | Source: Ambulatory Visit | Attending: Student | Admitting: Student

## 2021-11-23 DIAGNOSIS — R072 Precordial pain: Secondary | ICD-10-CM

## 2021-11-27 ENCOUNTER — Ambulatory Visit: Payer: BC Managed Care – PPO | Admitting: Student

## 2021-12-01 ENCOUNTER — Other Ambulatory Visit: Payer: Self-pay

## 2021-12-01 ENCOUNTER — Ambulatory Visit: Payer: BC Managed Care – PPO | Admitting: Student

## 2021-12-01 ENCOUNTER — Encounter: Payer: Self-pay | Admitting: Student

## 2021-12-01 VITALS — BP 136/80 | HR 92 | Temp 97.9°F | Ht 64.0 in | Wt 142.0 lb

## 2021-12-01 DIAGNOSIS — I340 Nonrheumatic mitral (valve) insufficiency: Secondary | ICD-10-CM

## 2021-12-01 DIAGNOSIS — R072 Precordial pain: Secondary | ICD-10-CM

## 2021-12-01 DIAGNOSIS — I071 Rheumatic tricuspid insufficiency: Secondary | ICD-10-CM

## 2021-12-01 DIAGNOSIS — I10 Essential (primary) hypertension: Secondary | ICD-10-CM
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
## 2021-12-03 ENCOUNTER — Ambulatory Visit: Payer: BC Managed Care – PPO | Admitting: Student

## 2022-02-17 DIAGNOSIS — R0981 Nasal congestion: Secondary | ICD-10-CM | POA: Insufficient documentation

## 2022-02-17 DIAGNOSIS — J342 Deviated nasal septum: Secondary | ICD-10-CM | POA: Insufficient documentation

## 2022-09-06 ENCOUNTER — Other Ambulatory Visit: Payer: Self-pay | Admitting: Sports Medicine

## 2022-09-06 DIAGNOSIS — M25551 Pain in right hip: Secondary | ICD-10-CM

## 2022-09-27 ENCOUNTER — Ambulatory Visit
Admission: RE | Admit: 2022-09-27 | Discharge: 2022-09-27 | Disposition: A | Discharge status: Home / Self Care | Payer: BC Managed Care – PPO | Source: Ambulatory Visit | Attending: Sports Medicine | Admitting: Sports Medicine

## 2022-09-27 DIAGNOSIS — M25551 Pain in right hip: Secondary | ICD-10-CM

## 2022-12-02 ENCOUNTER — Ambulatory Visit: Payer: BC Managed Care – PPO | Admitting: Student

## 2023-08-25 DIAGNOSIS — I10 Essential (primary) hypertension: Secondary | ICD-10-CM | POA: Insufficient documentation

## 2023-08-25 DIAGNOSIS — K219 Gastro-esophageal reflux disease without esophagitis: Secondary | ICD-10-CM | POA: Insufficient documentation

## 2023-08-25 DIAGNOSIS — E7800 Pure hypercholesterolemia, unspecified: Secondary | ICD-10-CM | POA: Insufficient documentation

## 2023-09-02 ENCOUNTER — Other Ambulatory Visit: Payer: Self-pay | Admitting: Obstetrics and Gynecology

## 2023-09-02 DIAGNOSIS — R928 Other abnormal and inconclusive findings on diagnostic imaging of breast: Secondary | ICD-10-CM

## 2023-09-15 ENCOUNTER — Ambulatory Visit
Admission: RE | Admit: 2023-09-15 | Discharge: 2023-09-15 | Disposition: A | Discharge status: Home / Self Care | Payer: BC Managed Care – PPO | Source: Ambulatory Visit | Attending: Obstetrics and Gynecology | Admitting: Obstetrics and Gynecology

## 2023-09-15 DIAGNOSIS — R928 Other abnormal and inconclusive findings on diagnostic imaging of breast: Secondary | ICD-10-CM

## 2023-09-16 ENCOUNTER — Other Ambulatory Visit: Payer: Self-pay | Admitting: Obstetrics and Gynecology

## 2023-09-16 DIAGNOSIS — R921 Mammographic calcification found on diagnostic imaging of breast: Secondary | ICD-10-CM

## 2023-09-21 ENCOUNTER — Ambulatory Visit
Admission: RE | Admit: 2023-09-21 | Discharge: 2023-09-21 | Disposition: A | Discharge status: Home / Self Care | Payer: BC Managed Care – PPO | Source: Ambulatory Visit | Attending: Obstetrics and Gynecology | Admitting: Obstetrics and Gynecology

## 2023-09-21 DIAGNOSIS — R921 Mammographic calcification found on diagnostic imaging of breast: Secondary | ICD-10-CM

## 2023-10-03 ENCOUNTER — Other Ambulatory Visit: Payer: Self-pay | Admitting: Obstetrics and Gynecology

## 2023-10-03 DIAGNOSIS — R921 Mammographic calcification found on diagnostic imaging of breast: Secondary | ICD-10-CM

## 2023-10-04 ENCOUNTER — Ambulatory Visit
Admission: RE | Admit: 2023-10-04 | Discharge: 2023-10-04 | Disposition: A | Discharge status: Home / Self Care | Payer: BC Managed Care – PPO | Source: Ambulatory Visit | Attending: Obstetrics and Gynecology | Admitting: Obstetrics and Gynecology

## 2023-10-04 ENCOUNTER — Other Ambulatory Visit: Payer: Self-pay | Admitting: Obstetrics and Gynecology

## 2023-10-04 DIAGNOSIS — R921 Mammographic calcification found on diagnostic imaging of breast: Secondary | ICD-10-CM

## 2023-11-03 ENCOUNTER — Encounter: Payer: Self-pay | Admitting: Emergency Medicine

## 2023-11-03 ENCOUNTER — Ambulatory Visit
Admission: EM | Admit: 2023-11-03 | Discharge: 2023-11-03 | Disposition: A | Discharge status: Home / Self Care | Payer: 59

## 2023-11-03 ENCOUNTER — Ambulatory Visit (INDEPENDENT_AMBULATORY_CARE_PROVIDER_SITE_OTHER): Payer: 59

## 2023-11-03 DIAGNOSIS — S99922A Unspecified injury of left foot, initial encounter: Secondary | ICD-10-CM

## 2023-11-03 DIAGNOSIS — S91312A Laceration without foreign body, left foot, initial encounter: Secondary | ICD-10-CM

## 2023-11-03 MED ORDER — TETANUS-DIPHTH-ACELL PERTUSSIS 5-2.5-18.5 LF-MCG/0.5 IM SUSY
0.5000 mL | PREFILLED_SYRINGE | Freq: Once | INTRAMUSCULAR | Status: AC
  Administered 2023-11-03: 0.5 mL via INTRAMUSCULAR

## 2023-11-03 NOTE — ED Triage Notes (Signed)
 Pt reports last night she was moving a hot pyrex plate that she dropped and it shattered on her L foot. Pt reports having a thick sock on and initially did not notice the cut or any pain. Later noticed bleeding through the sock. Cleaned area at home and applied bandaid. Laceration is less than 1/4 inch in length. Reports sharp, jabbing pain to area - 8/10 sitting, 10/10 standing. Reduced ROM and trouble walking reports limping since the incident.
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
## 2023-11-04 ENCOUNTER — Ambulatory Visit: Payer: Self-pay

## 2024-02-17 ENCOUNTER — Ambulatory Visit: Admitting: Podiatrist

## 2024-02-17 ENCOUNTER — Encounter: Payer: Self-pay | Admitting: Podiatrist

## 2024-02-17 ENCOUNTER — Ambulatory Visit (INDEPENDENT_AMBULATORY_CARE_PROVIDER_SITE_OTHER)

## 2024-02-17 DIAGNOSIS — M7752 Other enthesopathy of left foot: Secondary | ICD-10-CM

## 2024-02-17 DIAGNOSIS — M19072 Primary osteoarthritis, left ankle and foot: Secondary | ICD-10-CM

## 2024-02-17 NOTE — Progress Notes (Signed)
 Chief Complaint  Patient presents with   Foot Pain    RM#3 Patient presents today with left foot pain had dropped a hot plate on top of foot back in 09/2024 treated at urgent care states foot is painful and swell.     HPI: Patient is 63 y.o. female who presents today for pain on the dorsal lateral aspect of the left foot.  She states she dropped a hot Pyrex type dish on the foot this past December.  She states the foot has been painful ever since.  She also relates she has some swelling in the foot.  She stands for extended periods of time and relates pain with standing. Patient Active Problem List   Diagnosis Date Noted   Hypercholesterolemia 08/25/2023   Hypertensive disorder 08/25/2023   Gastroesophageal reflux disease 08/25/2023   Nasal congestion 02/17/2022   Nasal septal deviation 02/17/2022   Spondylolisthesis 06/17/2017   Lung nodule 05/25/2016   Dyspnea 04/09/2016   Essential (primary) hypertension 04/08/2016   Essential tremor 04/08/2016   Gastro-esophageal reflux disease without esophagitis 04/08/2016   S/P BSO (bilateral salpingo-oophorectomy) 12/30/2015   Chronic low back pain 11/14/2015   Lumbar radiculopathy 11/14/2015   Pseudoarthrosis of cervical spine (HCC) 11/14/2015   Cervical disc disease 04/29/2015   Hepatitis C 04/29/2015   COPD exacerbation (HCC) 04/29/2015   Chronic neck pain 05/23/2013   Cervical osteoarthritis 04/16/2013   Chronic hepatitis C virus infection (HCC) 04/16/2013   Cervical spinal stenosis 04/16/2013    Current Outpatient Medications on File Prior to Visit  Medication Sig Dispense Refill   albuterol  (VENTOLIN  HFA) 108 (90 Base) MCG/ACT inhaler Inhale 2 puffs into the lungs every 6 (six) hours as needed for wheezing or shortness of breath. 1 each 0   ALPRAZolam  (XANAX ) 0.25 MG tablet Take 0.25 mg by mouth daily as needed.     amLODipine  (NORVASC ) 10 MG tablet Take 10 mg by mouth daily.     Azilsartan Medoxomil (EDARBI) 40 MG TABS Take 40  mg by mouth daily.     celecoxib (CELEBREX) 100 MG capsule Take 100 mg by mouth as directed.  TAKE ONE CAPSULE BY MOUTH EVERY OTHER DAY AS DIRECTED FOR RHEUMATOID ARTHRITIS     cetirizine (ZYRTEC) 10 MG tablet Take 10 mg by mouth daily.     estradiol (ESTRACE) 0.1 MG/GM vaginal cream Place 1 Applicatorful vaginally daily.     estradiol (ESTRACE) 2 MG tablet Take 2 mg by mouth daily.  1   estradiol (ESTRACE) 2 MG tablet Take 1 tablet by mouth daily.     estradiol (VIVELLE-DOT) 0.1 MG/24HR patch Place 1 patch onto the skin as directed.     fluticasone  (FLONASE ) 50 MCG/ACT nasal spray Place 2 sprays into both nostrils daily.     GAVILYTE-G 236 g solution Take 4,000 mLs by mouth once.     levocetirizine (XYZAL) 5 MG tablet Take 1 tablet by mouth daily.     mometasone (NASONEX) 50 MCG/ACT nasal spray Place 1 spray into the nose 2 (two) times daily.     Multiple Vitamins-Minerals (EMERGEN-C IMMUNE PO)      Naproxen  DR 500 MG TBEC Take 1 tablet by mouth 3 (three) times daily as needed.     neomycin-polymyxin-hydrocortisone (CORTISPORIN) 3.5-10000-1 OTIC suspension 4 drops 3 (three) times daily.     oxybutynin  (DITROPAN -XL) 10 MG 24 hr tablet Take 10 mg by mouth daily.      oxybutynin  (DITROPAN -XL) 10 MG 24 hr tablet Take 1 tablet by mouth  daily.     pantoprazole  (PROTONIX ) 40 MG tablet Take 40 mg by mouth every morning.     triamcinolone cream (KENALOG) 0.1 % Apply 1 application  topically 2 (two) times daily as needed.     triamterene -hydrochlorothiazide  (MAXZIDE-25) 37.5-25 MG tablet Take 1 tablet by mouth daily.     valACYclovir (VALTREX) 1000 MG tablet Take 1,000 mg by mouth 2 (two) times daily.     ciprofloxacin (CIPRO) 500 MG tablet Take 500 mg by mouth 2 (two) times daily. (Patient not taking: Reported on 02/17/2024)     citalopram (CELEXA) 20 MG tablet Take 20 mg by mouth daily. (Patient not taking: Reported on 02/17/2024)     cyclobenzaprine  (FLEXERIL ) 10 MG tablet Take 10 mg by mouth 3 (three)  times daily as needed for muscle spasms. (Patient not taking: Reported on 02/17/2024)     DULoxetine (CYMBALTA) 60 MG capsule Take 60 mg by mouth 2 (two) times daily. (Patient not taking: Reported on 02/17/2024)     DULoxetine (CYMBALTA) 60 MG capsule Take 1 capsule by mouth 2 (two) times daily. (Patient not taking: Reported on 02/17/2024)     methocarbamol  (ROBAXIN ) 500 MG tablet Take 1 tablet by mouth 2 (two) times daily as needed. (Patient not taking: Reported on 02/17/2024)     oxyCODONE -acetaminophen  (PERCOCET/ROXICET) 5-325 MG tablet Take 1 tablet by mouth every 6 (six) hours as needed for severe pain (pain score 7-10). (Patient not taking: Reported on 02/17/2024)     No current facility-administered medications on file prior to visit.    Allergies  Allergen Reactions   Beeswax Hives and Itching   Morphine And Codeine Hives, Itching and Rash   Penicillins Hives, Itching and Rash    Has patient had a PCN reaction causing immediate rash, facial/tongue/throat swelling, SOB or lightheadedness with hypotension: no  Has patient had a PCN reaction causing severe rash involving mucus membranes or skin necrosis: no  Has patient had a PCN reaction that required hospitalization: was already in the hospital  Has patient had a PCN reaction occurring within the last 10 years: yes  If all of the above answers are "NO", then may proceed with Cephalosporin use.  Other Reaction(s): Other  Has patient had a PCN reaction causing immediate rash, facial/tongue/throat swelling, SOB or lightheadedness with hypotension: no  Has patient had a PCN reaction causing severe rash involving mucus membranes or skin necrosis: no  Has patient had a PCN reaction that required hospitalization: was already in the hospital  Has patient had a PCN reaction occurring within the last 10 years: yes  If all of the above answers are "NO", then may proceed with Cephalosporin use.  Has patient had a PCN reaction causing immediate  rash, facial/tongue/throat swelling, SOB or lightheadedness with hypotension: no  Has patient had a PCN reaction causing severe rash involving mucus membranes or skin necrosis: no  Has patient had a PCN reaction that required hospitalization: was already in the hospital  Has patient had a PCN reaction occurring within the last 10 years: yes  Has patient had a PCN reaction causing immediate rash, facial/tongue/throat swelling, SOB or lightheadedness with hypotension: no  Has patient had a PCN reaction causing severe rash involving mucus membranes or skin necrosis: no  Has patient had a PCN reaction that required hospitalization: was already in the hospital  Has patient had a PCN reaction occurring within the last 10 years: yes  If all of the above answers are "NO", then may proceed with Cephalosporin  use.  Has patient had a PCN reaction causing immediate rash, facial/tongue/throat swelling, SOB or lightheadedness with hypotension: no  Has patient had a PCN reaction causing severe rash involving mucus membranes or skin necrosis: no  Has patient had a PCN reaction that required hospitalization: was already in the hospital  Has patient had a PCN reaction occurring within the last 10 years: yes  Has patient had a PCN reaction causing immediate rash, facial/tongue/throat swelling, SOB or lightheadedness with hypotension: no  Has patient had a PCN reaction causing severe rash involving mucus membranes or skin necrosis: no  Has patient had a PCN reaction that required hospitalization: was already in the hospital  Has patient had a PCN reaction occurring within the last 10 years: yes  If all of the above answers are "NO", then may proceed with Cephalosporin use.  Has patient had a PCN reaction causing immediate rash, facial/tongue/throat swelling, SOB or lightheadedness with hypotension: no  Has patient had a PCN reaction causing severe rash involving mucus membranes or skin necrosis: no  Has patient had  a PCN reaction that required hospitalization: was already in the hospital  Has patient had a PCN reaction occurring within the last 10 years: yes  Has patient had a PCN reaction causing immediate rash, facial/tongue/throat swelling, SOB or lightheadedness with hypotension: no  Has patient had a PCN reaction causing severe rash involving mucus membranes or skin necrosis: no  Has patient had a PCN reaction that required hospitalization: was already in the hospital  Has patient had a PCN reaction occurring within the last 10 years: yes  If all of the above answers are "NO", then may proceed with Cephalosporin use.  Has patient had a PCN reaction causing immediate rash, facial/tongue/throat swelling, SOB or lightheadedness with hypotension: no  Has patient had a PCN reaction causing severe rash involving mucus membranes or skin necrosis: no  Has patient had a PCN reaction that required hospitalization: was already in the hospital  Has patient had a PCN reaction occurring within the last 10 years: yes  Has patient had a PCN reaction causing immediate rash, facial/tongue/throat swelling, SOB or lightheadedness with hypotension: no  Has patient had a PCN reaction causing severe rash involving mucus membranes or skin necrosis: no  Has patient had a PCN reaction that required hospitalization: was already in the hospital  Has patient had a PCN reaction occurring within the last 10 years: yes  If all of the above answers are "NO", then may proceed with Cephalosporin use.  Has patient had a PCN reaction causing immediate rash, facial/tongue/throat swelling, SOB or lightheadedness with hypotension: no  Has patient had a PCN reaction causing severe r... (TRUNCATED)  Has patient had a PCN reaction causing immediate rash, facial/tongue/throat swelling, SOB or lightheadedness with hypotension: no, Has patient had a PCN reaction causing severe rash involving mucus membranes or skin necrosis: no, Has patient had a  PCN reaction that required hospitalization: was already in the hospital, Has patient had a PCN reaction occurring within the last 10 years: yes, If all of the above answers are "NO", then may proceed with Cephalosporin use., Has patient had a PCN reaction caus   Solifenacin Itching and Other (See Comments)    vertigo  Other Reaction(s): Other  vertigo  vertigo  vertigo   Sulfa Antibiotics Hives and Itching   Nitrofurantoin Hives   Amoxicillin Hives and Itching    Has patient had a PCN reaction causing immediate rash, facial/tongue/throat swelling, SOB or lightheadedness with  hypotension: no Has patient had a PCN reaction causing severe rash involving mucus membranes or skin necrosis: no Has patient had a PCN reaction that required hospitalization: was already in the hospital Has patient had a PCN reaction occurring within the last 10 years: yes   Misc. Sulfonamide Containing Compounds    Solifenacin Succinate     Review of Systems No fevers, chills, nausea, muscle aches, no difficulty breathing, no calf pain, no chest pain or shortness of breath.   Physical Exam  GENERAL APPEARANCE: Alert, conversant. Appropriately groomed. No acute distress.   VASCULAR: Pedal pulses palpable 2/4 DP and 2/4 PT bilateral.  Capillary refill time is immediate to all digits,  Proximal to distal cooling is warm to warm.  Digital perfusion adequate.   NEUROLOGIC: sensation is intact to 5.07 monofilament at 5/5 sites bilateral.  Light touch is intact bilateral, vibratory sensation intact bilateral  MUSCULOSKELETAL: High arched foot type is noted bilateral.  Pain on direct palpation of the navicular tuberosity and dorsal aspect of the navicular of the left foot is noted.  A prominent area of inflammation is also noted in this area of discomfort.  DERMATOLOGIC: skin is warm, supple, and dry.  Color, texture, and turgor of skin within normal limits.  X-rays reveal a high arch foot type.  Arthritic changes  at the midfoot and specifically the navicular are noted on x-ray.  A large bony prominence is noted on the navicular on x-ray from the arthritic changes.  This is the area of discomfort.  No acute osseous abnormalities are seen.  No acute joint abnormalities are noted.  Assessment     ICD-10-CM   1. Capsulitis of metatarsophalangeal (MTP) joint of left foot  M77.52 DG Foot Complete Left    2. Arthritis of left midfoot  M19.072        Plan  Discussed exam and x-ray findings with the patient.  Discussed the arthritic changes in the inflammation around them at this time.  Discussed it was likely exacerbated by dropping the hot serving dish on her foot.  At today's visit I recommended orthotics for her high arched foot type as well as an injection to calm the inflammation.  She would like to proceed with both.  I put the skin with alcohol infiltrated 10 mg Kenalog with Marcaine  plain at the area of inflammation on the left foot.  She tolerated this well.  I will have her follow-up with Trish for orthotics at her convenience.

## 2024-04-09 ENCOUNTER — Other Ambulatory Visit

## 2024-06-04 ENCOUNTER — Other Ambulatory Visit: Payer: Self-pay | Admitting: Nurse Practitioner

## 2024-06-04 DIAGNOSIS — Z1231 Encounter for screening mammogram for malignant neoplasm of breast: Secondary | ICD-10-CM

## 2024-08-21 ENCOUNTER — Encounter: Payer: Self-pay | Admitting: Internal Medicine

## 2024-09-07 ENCOUNTER — Ambulatory Visit: Admitting: Podiatry

## 2024-09-07 ENCOUNTER — Encounter: Payer: Self-pay | Admitting: Podiatry

## 2024-09-07 DIAGNOSIS — M25572 Pain in left ankle and joints of left foot: Secondary | ICD-10-CM | POA: Diagnosis not present

## 2024-09-07 MED ORDER — TRIAMCINOLONE ACETONIDE 40 MG/ML IJ SUSP
40.0000 mg | Freq: Once | INTRAMUSCULAR | Status: AC
  Administered 2024-09-07: 40 mg

## 2024-09-07 NOTE — Progress Notes (Signed)
 Patient presents complaint of pain along the dorsal medial aspect of the foot on the left.  She had injection by Dr. Mellody back in April of this year.  Still bothering her so the shot helped a little bit but now it started to get painful again.   Physical exam:  General appearance: Pleasant, and in no acute distress. AOx3.  Vascular: Pedal pulses: DP 2/4 bilaterally, PT 2/4 bilaterally.  Mild edema lower legs bilaterally. Capillary fill time immediate bilaterally.  Neurological: Light touch intact feet bilaterally.  Normal Achilles reflex bilaterally  Dermatologic:   Skin normal temperature bilaterally.  Skin normal color, tone, and texture bilaterally.   Musculoskeletal: Tenderness over the dorsal medial aspect of the foot mostly along the navicular cuneiform joint and some at the first TMT left.  Tenderness with range of motion of the navicular cuneiform joint left.  Prominent osteophytic changes over the joints.   Diagnosis: 1.  Arthralgia naviculocuneiform joint left  Plan: -Discussed the pain at the joint.  Will try an injection today.  Reviewed radiographs taken before and there is severe degenerative changes with joint space narrowing and osteophytic changes across Lisfranc's joint at the cuneiform navicular joints left.  No ascending fractures.  -injected 3cc 2:1 mixture 0.5 cc Marcaine :Kenolog 10mg /102ml at cuneiform navicular joint left.    Return 3 weeks follow-up injection left

## 2024-10-02 ENCOUNTER — Ambulatory Visit: Admitting: Podiatry
